# Patient Record
Sex: Female | Born: 1985 | Race: White | Hispanic: No | Marital: Married | Smoking: Never smoker
Health system: Southern US, Community
[De-identification: ages and names within clinical notes are randomized; demographics above are authoritative.]

## PROBLEM LIST (undated history)

## (undated) DIAGNOSIS — Z1379 Encounter for other screening for genetic and chromosomal anomalies: Secondary | ICD-10-CM

## (undated) DIAGNOSIS — E039 Hypothyroidism, unspecified: Secondary | ICD-10-CM

## (undated) DIAGNOSIS — F419 Anxiety disorder, unspecified: Secondary | ICD-10-CM

## (undated) DIAGNOSIS — T7840XA Allergy, unspecified, initial encounter: Secondary | ICD-10-CM

## (undated) DIAGNOSIS — R519 Headache, unspecified: Secondary | ICD-10-CM

## (undated) DIAGNOSIS — Z148 Genetic carrier of other disease: Secondary | ICD-10-CM

## (undated) DIAGNOSIS — Z9189 Other specified personal risk factors, not elsewhere classified: Secondary | ICD-10-CM

## (undated) DIAGNOSIS — Z803 Family history of malignant neoplasm of breast: Secondary | ICD-10-CM

## (undated) HISTORY — DX: Family history of malignant neoplasm of breast: Z80.3

## (undated) HISTORY — DX: Encounter for other screening for genetic and chromosomal anomalies: Z13.79

## (undated) HISTORY — DX: Anxiety disorder, unspecified: F41.9

## (undated) HISTORY — PX: OTHER SURGICAL HISTORY: SHX169

## (undated) HISTORY — DX: Hypothyroidism, unspecified: E03.9

## (undated) HISTORY — DX: Genetic carrier of other disease: Z14.8

## (undated) HISTORY — DX: Allergy, unspecified, initial encounter: T78.40XA

## (undated) HISTORY — DX: Other specified personal risk factors, not elsewhere classified: Z91.89

## (undated) HISTORY — PX: FOOT GANGLION EXCISION: SHX1660

---

## 2009-01-08 ENCOUNTER — Ambulatory Visit: Payer: Self-pay | Admitting: Internal Medicine

## 2009-09-20 ENCOUNTER — Emergency Department: Payer: Self-pay | Admitting: Emergency Medicine

## 2012-02-17 ENCOUNTER — Observation Stay: Payer: Self-pay

## 2012-02-23 ENCOUNTER — Observation Stay: Payer: Self-pay | Admitting: Obstetrics & Gynecology

## 2012-03-06 ENCOUNTER — Observation Stay: Payer: Self-pay

## 2012-03-08 ENCOUNTER — Inpatient Hospital Stay: Payer: Self-pay

## 2012-03-08 LAB — CBC WITH DIFFERENTIAL/PLATELET
Basophil #: 0 10*3/uL (ref 0.0–0.1)
Basophil %: 0.4 %
Eosinophil #: 0.1 10*3/uL (ref 0.0–0.7)
HCT: 40.9 % (ref 35.0–47.0)
Lymphocyte #: 2.4 10*3/uL (ref 1.0–3.6)
MCH: 33.5 pg (ref 26.0–34.0)
MCHC: 35.1 g/dL (ref 32.0–36.0)
MCV: 96 fL (ref 80–100)
Monocyte #: 1.5 x10 3/mm — ABNORMAL HIGH (ref 0.2–0.9)
Monocyte %: 13.5 %
Neutrophil #: 6.7 10*3/uL — ABNORMAL HIGH (ref 1.4–6.5)
RDW: 13.8 % (ref 11.5–14.5)
WBC: 10.7 10*3/uL (ref 3.6–11.0)

## 2012-03-10 LAB — HEMATOCRIT: HCT: 30.5 % — ABNORMAL LOW (ref 35.0–47.0)

## 2014-02-16 ENCOUNTER — Ambulatory Visit: Payer: Self-pay

## 2014-02-17 ENCOUNTER — Ambulatory Visit: Payer: Self-pay

## 2014-03-19 ENCOUNTER — Ambulatory Visit: Payer: Self-pay

## 2014-03-20 ENCOUNTER — Observation Stay: Payer: Self-pay | Admitting: Obstetrics and Gynecology

## 2014-03-27 ENCOUNTER — Inpatient Hospital Stay: Payer: Self-pay | Admitting: Obstetrics and Gynecology

## 2014-03-27 LAB — CBC WITH DIFFERENTIAL/PLATELET
BASOS ABS: 0 10*3/uL (ref 0.0–0.1)
Basophil %: 0.2 %
Eosinophil #: 0 10*3/uL (ref 0.0–0.7)
Eosinophil %: 0.1 %
HCT: 44.3 % (ref 35.0–47.0)
HGB: 14.7 g/dL (ref 12.0–16.0)
LYMPHS ABS: 1.3 10*3/uL (ref 1.0–3.6)
Lymphocyte %: 12.6 %
MCH: 32 pg (ref 26.0–34.0)
MCHC: 33.2 g/dL (ref 32.0–36.0)
MCV: 96 fL (ref 80–100)
MONOS PCT: 8.1 %
Monocyte #: 0.9 x10 3/mm (ref 0.2–0.9)
Neutrophil #: 8.4 10*3/uL — ABNORMAL HIGH (ref 1.4–6.5)
Neutrophil %: 79 %
Platelet: 166 10*3/uL (ref 150–440)
RBC: 4.6 10*6/uL (ref 3.80–5.20)
RDW: 13.9 % (ref 11.5–14.5)
WBC: 10.6 10*3/uL (ref 3.6–11.0)

## 2014-03-28 LAB — HEMATOCRIT: HCT: 38.1 % (ref 35.0–47.0)

## 2014-10-27 NOTE — H&P (Signed)
L&D Evaluation:  History:   HPI 29 year old G1 P0 with EDC9/21/2013 by LMP=06/03/2011 presents at 47 5/7 weeks with c/o strong regular contractions since 1700 tonight. Rates pain 8-9/10. No VB or LOF. Some muciod discharge. Baby active. Prenatal care at [facility] with a 10wk 6 day ultrasound confirming dates (EDC=03/12/2012). PNC also complicated by hypothyroidism, anxiety and migraine headache. LABS:O POS, RI, VI, GBS negative.    Presents with contractions    Patient's Medical History Anxiety, Hypothyroidism, Migraine headache    Patient's Surgical History none    Medications Pre Natal Vitamins  Synthroid 100 mcg daily, Zoloft 75 mgm daily.    Allergies NKDA    Social History none  Married    Family History Non-Contributory   ROS:   ROS see HPI   Exam:   Vital Signs 134/80. 98.2-79-18    Urine Protein not completed    General no apparent distress, appears anxious    Mental Status clear    Chest clear    Heart normal sinus rhythm, no murmur/gallop/rubs    Abdomen gravid, tender with contractions    Estimated Fetal Weight Average for gestational age, 12 1/2 to 7#    Fetal Position cephalic    Edema no edema    Reflexes 1+    Pelvic 1.5/80%/-2/vtx per RN exam    Mebranes Intact    FHT normal rate with no decels, 140 with accels to 170s to 180    FHT Description Cat 1    Fetal Heart Rate 140    Ucx q2-4 min,    Skin dry   Impression:   Impression IUP at 39 5/7 weeks with probable early labor   Plan:   Plan EFM/NST, monitor contractions and for cervical change, Rest with morphine 10 mgm and Phenrgan 12.5 mgm IM   Electronic Signatures: Dalia Heading L (CNM)  (Signed 18-Sep-13 22:51)  Authored: L&D Evaluation   Last Updated: 18-Sep-13 22:51 by Karene Fry (CNM)

## 2014-10-27 NOTE — H&P (Signed)
L&D Evaluation:  History:   HPI 29 year old G1 P0 with EDC9/21/2013 by LMP=06/03/2011 presents at 37 weeks with c/o contractions all day and that are now 7-10 min apart. No VB or LOF. Prenatal care at [facility] with a 10wk 6 day ultrasound confirming dates (EDC=03/12/2012). PNC also complicated by hypothyroidism, anxiety and migraine headache. LABS:O POS, RI, VI, GBS negative.    Presents with contractions    Patient's Medical History Anxiety, Hypothyroidism, Migraine headache    Patient's Surgical History none    Medications Pre Natal Vitamins  Synthroid 100 mcg daily, Zoloft 75 mgm daily.    Allergies NKDA    Social History none  Married    Family History Non-Contributory   ROS:   ROS see HPI   Exam:   Vital Signs 123/87, 118/79, [id number]    General no apparent distress    Mental Status clear    Abdomen gravid, non-tender, mild contraction palpated    Estimated Fetal Weight Average for gestational age    Fetal Position cephalic    Edema no edema    Reflexes 1+    Pelvic closed/50-60%/-2    Mebranes Intact    FHT normal rate with no decels    FHT Description initially baseline 140s with accels to 160s to 180s, then baseline chnged to 130 when baby not as active.    Fetal Heart Rate 135    Ucx q2-7, inconsistent duration and strength    Skin dry   Impression:   Impression IUP at 37 weeks with pteterm contractions. False labor vs very early latent labor. Reactive NST   Plan:   Plan Discharge home with labor precautions. FU next week as scheduled   Electronic Signatures: Karene Fry (CNM)  (Signed 01-Sep-13 11:02)  Authored: L&D Evaluation   Last Updated: 01-Sep-13 11:02 by Karene Fry (CNM)

## 2014-10-27 NOTE — H&P (Signed)
L&D Evaluation:  History:  HPI -CC: irregular UCs -HPI: 29 y/o G2P1001 @ 35/6 with the above CC. Preg c/b hypothyroidism (on synthroid) and anxiety (on zoloft). No VB, LOF or decreased FM. Patient called the office and was advised to come in for eval.   Allergies NKDA   Social History none   Family History Non-Contributory   Exam:  Vital Signs AF VS normal and stable   General no apparent distress   Mental Status clear   Chest no respiratory distress   Abdomen gravid, non-tender   Pelvic cl/long/high x 2 (over several hours)   FHT 130 baseline, +accels, no decels, mod var   Ucx irregular   Impression:  Impression negative EOL   Plan:  Comments *IUP: rNST *EOL: PTL precautions given *continue home medications *Dispo: d/c to home and already has f/u for monday   Follow Up Appointment already scheduled   Electronic Signatures: Aletha Halim (MD)  (Signed 02-Oct-15 20:35)  Authored: L&D Evaluation   Last Updated: 02-Oct-15 20:35 by Aletha Halim (MD)

## 2014-10-27 NOTE — H&P (Signed)
L&D Evaluation:  History:  HPI 29 y/o G2P1001 @ [redacted]w[redacted]d with contractions starting this morning. On presentation 3-4 cm, spontaneously ruptured during rule out period and admitted for term labor.  Preg c/b hypothyroidism (on synthroid) and anxiety (on zoloft). No VB,  +FM   Presents with contractions   Patient's Medical History hypothyroidism   Patient's Surgical History none   Medications Pre Natal Vitamins   Allergies NKDA   Social History none   Family History Non-Contributory   Exam:  Vital Signs AF VS normal and stable   General no apparent distress   Mental Status clear   Chest no respiratory distress   Abdomen gravid, non-tender   Pelvic 3-4/50/-2 SROM clear   Mebranes Ruptured   FHT reactive tracing, cat I   Ucx irregular, q4-5min   Impression:  Impression active labor   Plan:  Comments admit for term labor, GBS negative   Follow Up Appointment already scheduled   Electronic Signatures: Dorthula Nettles (MD)  (Signed 09-Oct-15 11:33)  Authored: L&D Evaluation   Last Updated: 09-Oct-15 11:33 by Dorthula Nettles (MD)

## 2015-10-07 DIAGNOSIS — M25571 Pain in right ankle and joints of right foot: Secondary | ICD-10-CM | POA: Diagnosis not present

## 2015-10-07 DIAGNOSIS — M67472 Ganglion, left ankle and foot: Secondary | ICD-10-CM | POA: Diagnosis not present

## 2015-11-11 DIAGNOSIS — M67472 Ganglion, left ankle and foot: Secondary | ICD-10-CM | POA: Diagnosis not present

## 2015-11-11 DIAGNOSIS — M25571 Pain in right ankle and joints of right foot: Secondary | ICD-10-CM | POA: Diagnosis not present

## 2015-11-11 DIAGNOSIS — M65871 Other synovitis and tenosynovitis, right ankle and foot: Secondary | ICD-10-CM | POA: Diagnosis not present

## 2015-12-01 DIAGNOSIS — M67472 Ganglion, left ankle and foot: Secondary | ICD-10-CM | POA: Diagnosis not present

## 2015-12-24 DIAGNOSIS — J029 Acute pharyngitis, unspecified: Secondary | ICD-10-CM | POA: Diagnosis not present

## 2016-03-07 DIAGNOSIS — M67472 Ganglion, left ankle and foot: Secondary | ICD-10-CM | POA: Diagnosis not present

## 2016-03-10 DIAGNOSIS — M85472 Solitary bone cyst, left ankle and foot: Secondary | ICD-10-CM | POA: Diagnosis not present

## 2016-04-03 DIAGNOSIS — H5711 Ocular pain, right eye: Secondary | ICD-10-CM | POA: Diagnosis not present

## 2016-04-14 DIAGNOSIS — Z23 Encounter for immunization: Secondary | ICD-10-CM | POA: Diagnosis not present

## 2016-05-09 DIAGNOSIS — M67472 Ganglion, left ankle and foot: Secondary | ICD-10-CM | POA: Diagnosis not present

## 2016-05-09 DIAGNOSIS — M79672 Pain in left foot: Secondary | ICD-10-CM | POA: Diagnosis not present

## 2016-06-06 DIAGNOSIS — M79672 Pain in left foot: Secondary | ICD-10-CM | POA: Diagnosis not present

## 2016-06-06 DIAGNOSIS — M67472 Ganglion, left ankle and foot: Secondary | ICD-10-CM | POA: Diagnosis not present

## 2016-07-10 DIAGNOSIS — R51 Headache: Secondary | ICD-10-CM | POA: Diagnosis not present

## 2016-07-10 DIAGNOSIS — H6691 Otitis media, unspecified, right ear: Secondary | ICD-10-CM | POA: Diagnosis not present

## 2016-08-31 ENCOUNTER — Encounter: Payer: Self-pay | Admitting: Obstetrics and Gynecology

## 2016-08-31 ENCOUNTER — Ambulatory Visit (INDEPENDENT_AMBULATORY_CARE_PROVIDER_SITE_OTHER): Payer: BLUE CROSS/BLUE SHIELD | Admitting: Obstetrics and Gynecology

## 2016-08-31 VITALS — BP 130/70 | HR 79 | Ht 59.0 in | Wt 138.0 lb

## 2016-08-31 DIAGNOSIS — Z131 Encounter for screening for diabetes mellitus: Secondary | ICD-10-CM

## 2016-08-31 DIAGNOSIS — Z13220 Encounter for screening for lipoid disorders: Secondary | ICD-10-CM | POA: Diagnosis not present

## 2016-08-31 DIAGNOSIS — Z6827 Body mass index (BMI) 27.0-27.9, adult: Secondary | ICD-10-CM | POA: Diagnosis not present

## 2016-08-31 DIAGNOSIS — Z1329 Encounter for screening for other suspected endocrine disorder: Secondary | ICD-10-CM | POA: Diagnosis not present

## 2016-08-31 DIAGNOSIS — Z803 Family history of malignant neoplasm of breast: Secondary | ICD-10-CM

## 2016-08-31 DIAGNOSIS — E663 Overweight: Secondary | ICD-10-CM

## 2016-08-31 DIAGNOSIS — Z01419 Encounter for gynecological examination (general) (routine) without abnormal findings: Secondary | ICD-10-CM

## 2016-08-31 DIAGNOSIS — Z8632 Personal history of gestational diabetes: Secondary | ICD-10-CM | POA: Diagnosis not present

## 2016-08-31 MED ORDER — LEVOTHYROXINE SODIUM 75 MCG PO TABS: 75.0000 ug | ORAL_TABLET | Freq: Every day | ORAL | 11 refills | Status: DC

## 2016-08-31 MED ORDER — SERTRALINE HCL 100 MG PO TABS: 100.0000 mg | ORAL_TABLET | Freq: Every day | ORAL | 11 refills | Status: DC

## 2016-08-31 NOTE — Progress Notes (Signed)
Gynecology Annual Exam  PCP: No PCP Per Patient  Chief Complaint: No chief complaint on file.   History of Present Illness: Patient is a 31 y.o. G2P2 presents for annual exam. The patient has no complaints today.   LMP: No LMP recorded. Patient is not currently having periods (Reason: IUD). Has occasional spotting with IUD but not frequent, no cramping or dysmenorrhea.  The patient is sexually active. She currently uses IUD: Present: yes for contraception. She denies dyspareunia.  The patient does perform self breast exams.  There is notable family history of breast or ovarian cancer in her family.  Patient sister had triple negative breast cancer at age 55.  The patient wears seatbelts: yes.   The patient has regular exercise: not asked.    The patient denies current symptoms of depression, stable on current dose of Zoloft.    Review of Systems: Review of Systems  Constitutional: Negative for chills and fever.  HENT: Negative for congestion.   Respiratory: Negative for cough and shortness of breath.   Cardiovascular: Negative for chest pain and palpitations.  Gastrointestinal: Negative for abdominal pain, constipation, diarrhea, heartburn, nausea and vomiting.  Genitourinary: Negative for dysuria, frequency and urgency.  Skin: Negative for itching and rash.  Neurological: Negative for dizziness and headaches.  Endo/Heme/Allergies: Negative for polydipsia.  Psychiatric/Behavioral: Negative for depression.   Past Medical History:  History reviewed. No pertinent past medical history.  Past Surgical History:  History reviewed. No pertinent surgical history.  Gynecologic History:  No LMP recorded. Patient is not currently having periods (Reason: IUD). Contraception: IUD placed 05/08/2014 Mirena Last Pap: Results were: NIL and HR HPV negative 2015   Obstetric History: G2P2  Family History:  Family History  Problem Relation Age of Onset  . Breast cancer Sister 25     Social History:  Social History   Social History  . Marital status: Married    Spouse name: N/A  . Number of children: N/A  . Years of education: N/A   Occupational History  . Not on file.   Social History Main Topics  . Smoking status: Never Smoker  . Smokeless tobacco: Never Used  . Alcohol use No  . Drug use: No  . Sexual activity: Yes    Birth control/ protection: IUD   Other Topics Concern  . Not on file   Social History Narrative  . No narrative on file    Allergies:  No Known Allergies  Medications: Prior to Admission medications   Not on File    Physical Exam Vitals: Blood pressure 130/70, pulse 79, height 4' 11" (1.499 m), weight 138 lb (62.6 kg). Body mass index is 27.87 kg/m.  General: NAD HEENT: normocephalic, anicteric Thyroid: no enlargement, no palpable nodules Pulmonary: No increased work of breathing, CTAB Cardiovascular: RRR, distal pulses 2+ Breast: Breast symmetrical, no tenderness, no palpable nodules or masses, no skin or nipple retraction present, no nipple discharge.  No axillary or supraclavicular lymphadenopathy. Abdomen: NABS, soft, non-tender, non-distended.  Umbilicus without lesions.  No hepatomegaly, splenomegaly or masses palpable. No evidence of hernia  Genitourinary:  External: Normal external female genitalia.  Normal urethral meatus, normal  Bartholin's and Skene's glands.    Vagina: Normal vaginal mucosa, no evidence of prolapse.    Cervix: Grossly normal in appearance, no bleeding  Uterus: Non-enlarged, mobile, normal contour.  No CMT, IUD strings present  Adnexa: ovaries non-enlarged, no adnexal masses  Rectal: deferred  Lymphatic: no evidence of inguinal lymphadenopathy Extremities:  no edema, erythema, or tenderness Neurologic: Grossly intact Psychiatric: mood appropriate, affect full  Female chaperone present for pelvic and breast  portions of the physical exam    Assessment: 31 y.o. G2P2 routine annual  exam  Plan: Problem List Items Addressed This Visit    None    Visit Diagnoses    Encounter for annual routine gynecological examination    -  Primary   Relevant Orders   CMP14+LP+TP+TSH+CBC/Plt   Thyroid disorder screening       Relevant Orders   CMP14+LP+TP+TSH+CBC/Plt   Screening cholesterol level       Relevant Orders   CMP14+LP+TP+TSH+CBC/Plt   Diabetes mellitus screening       Relevant Orders   CMP14+LP+TP+TSH+CBC/Plt   History of gestational diabetes       Relevant Orders   CMP14+LP+TP+TSH+CBC/Plt   Overweight (BMI 25.0-29.9)       Relevant Orders   CMP14+LP+TP+TSH+CBC/Plt   BMI 27.0-27.9,adult       Relevant Orders   CMP14+LP+TP+TSH+CBC/Plt   Family history of breast cancer in first degree relative          1) STI screening was offered and declined  2) ASCCP guidelines and rational discussed.  Patient opts for every 5 years screening interval  3) Contraception -Continue Mirena IUD placed 05/08/2014  4) Routine healthcare maintenance including cholesterol, diabetes screening discussed To return fasting at a later date  5) Family history of breast cancer - female first degree relative with breast cancer triple negative at age 26, offered myriad testing and given handout  6) Follow up 1 year for routine annual exam  

## 2016-08-31 NOTE — Patient Instructions (Signed)
BRCA Gene Testing Why am I having this test? BRCA gene testing is done to check for the presence of harmful changes (mutations) in the BRCA1 gene or the BRCA2 gene (breast cancer susceptibility genes). If there is a mutation, the genes may not be able to help repair damaged cells in the body. As a result, the damaged cells may develop defects that can lead to certain types of cancer. You may have this test if you have a family history of certain types of cancer, including cancer of the:  Breast.  Ovaries.  Fallopian tubes.  Peritoneum.  Pancreas.  Prostate. What kind of sample is taken? The test requires either a sample of blood or a sample of cells from your saliva. If a sample of blood is needed, it will probably be collected by inserting a needle into a vein. If a sample of saliva is needed, you will get instructions about how to collect the sample. What do the results mean? The test results can show whether you have a mutation in the BRCA1 or BRCA2 gene that increases your risk for certain cancers. Meaning of negative test results  A negative test result means that you do not have a mutation in the BRCA1 or BRCA2 gene that is known to increase your risk for certain cancers. This does not mean that you will never get cancer. Talk with your health care provider or a genetic counselor about what this result means for you. Meaning of positive test results  A positive test result means that you have a mutation in the BRCA1 or BRCA2 gene that increases your risk for certain cancers. Women with a positive test result have an increased risk for breast and ovarian cancer. Both women and men with a mutation have an increased risk for breast cancer and may be at greater risk for other types of cancer. Getting a positive test result does not mean that you will develop cancer. Talk with your health care provider or a genetic counselor about what this result means for you. You may be told that you are  a carrier. This means that you can pass the mutation to your children. Meaning of ambiguous test results  Ambiguous, inconclusive, or uncertain test results mean that there is a change in the BRCA1 or BRCA2 gene, but it is a change that has not been linked to cancer. Talk with your health care provider or a genetic counselor about what this result means for you. Talk with your health care provider to discuss your results, treatment options, and if necessary, the need for more tests. Talk with your health care provider if you have any questions about your results. How do I get my results? It is up to you to get your test results. Ask your health care provider, or the department that is doing the test, when your results will be ready. This information is not intended to replace advice given to you by your health care provider. Make sure you discuss any questions you have with your health care provider. Document Released: 06/29/2004 Document Revised: 02/07/2016 Document Reviewed: 01/26/2016 Elsevier Interactive Patient Education  2017 [employer].

## 2016-09-11 ENCOUNTER — Encounter: Payer: Self-pay | Admitting: Obstetrics and Gynecology

## 2016-10-20 ENCOUNTER — Other Ambulatory Visit: Payer: BLUE CROSS/BLUE SHIELD

## 2016-10-20 DIAGNOSIS — Z01419 Encounter for gynecological examination (general) (routine) without abnormal findings: Secondary | ICD-10-CM

## 2016-10-20 DIAGNOSIS — Z1329 Encounter for screening for other suspected endocrine disorder: Secondary | ICD-10-CM

## 2016-10-20 DIAGNOSIS — Z13220 Encounter for screening for lipoid disorders: Secondary | ICD-10-CM

## 2016-10-20 DIAGNOSIS — Z131 Encounter for screening for diabetes mellitus: Secondary | ICD-10-CM

## 2016-10-20 DIAGNOSIS — E663 Overweight: Secondary | ICD-10-CM

## 2016-10-20 DIAGNOSIS — Z8632 Personal history of gestational diabetes: Secondary | ICD-10-CM | POA: Diagnosis not present

## 2016-10-20 DIAGNOSIS — Z6827 Body mass index (BMI) 27.0-27.9, adult: Secondary | ICD-10-CM | POA: Diagnosis not present

## 2016-10-21 LAB — CMP14+LP+TP+TSH+CBC/PLT
A/G RATIO: 1.4 (ref 1.2–2.2)
ALT: 12 IU/L (ref 0–32)
AST: 18 IU/L (ref 0–40)
Albumin: 4.3 g/dL (ref 3.5–5.5)
Alkaline Phosphatase: 85 IU/L (ref 39–117)
BILIRUBIN TOTAL: 0.4 mg/dL (ref 0.0–1.2)
BUN/Creatinine Ratio: 17 (ref 9–23)
BUN: 13 mg/dL (ref 6–20)
CALCIUM: 9.7 mg/dL (ref 8.7–10.2)
CO2: 24 mmol/L (ref 18–29)
Chloride: 100 mmol/L (ref 96–106)
Cholesterol, Total: 145 mg/dL (ref 100–199)
Creatinine, Ser: 0.75 mg/dL (ref 0.57–1.00)
Free Thyroxine Index: 2 (ref 1.2–4.9)
GFR calc Af Amer: 121 mL/min/{1.73_m2} (ref >59)
GFR calc non Af Amer: 105 mL/min/{1.73_m2} (ref >59)
GLOBULIN, TOTAL: 3 g/dL (ref 1.5–4.5)
Glucose: 85 mg/dL (ref 65–99)
HDL: 33 mg/dL — ABNORMAL LOW (ref >39)
Hematocrit: 42.2 % (ref 34.0–46.6)
Hemoglobin: 14.2 g/dL (ref 11.1–15.9)
LDL CALC: 101 mg/dL — AB (ref 0–99)
LDL/HDL RATIO: 3.1 ratio (ref 0.0–3.2)
MCH: 30.2 pg (ref 26.6–33.0)
MCHC: 33.6 g/dL (ref 31.5–35.7)
MCV: 90 fL (ref 79–97)
POTASSIUM: 4.5 mmol/L (ref 3.5–5.2)
Platelets: 197 10*3/uL (ref 150–379)
RBC: 4.7 x10E6/uL (ref 3.77–5.28)
RDW: 13 % (ref 12.3–15.4)
SODIUM: 139 mmol/L (ref 134–144)
T3 UPTAKE RATIO: 30 % (ref 24–39)
T4, Total: 6.5 ug/dL (ref 4.5–12.0)
TRIGLYCERIDES: 53 mg/dL (ref 0–149)
TSH: 0.834 u[IU]/mL (ref 0.450–4.500)
Total Protein: 7.3 g/dL (ref 6.0–8.5)
VLDL Cholesterol Cal: 11 mg/dL (ref 5–40)
WBC: 4.1 10*3/uL (ref 3.4–10.8)

## 2016-10-23 ENCOUNTER — Encounter: Payer: Self-pay | Admitting: Obstetrics and Gynecology

## 2016-11-17 DIAGNOSIS — Z1379 Encounter for other screening for genetic and chromosomal anomalies: Secondary | ICD-10-CM

## 2016-11-17 HISTORY — DX: Encounter for other screening for genetic and chromosomal anomalies: Z13.79

## 2016-12-07 ENCOUNTER — Encounter: Payer: Self-pay | Admitting: Obstetrics and Gynecology

## 2016-12-08 ENCOUNTER — Ambulatory Visit (INDEPENDENT_AMBULATORY_CARE_PROVIDER_SITE_OTHER): Payer: BLUE CROSS/BLUE SHIELD | Admitting: Obstetrics and Gynecology

## 2016-12-08 ENCOUNTER — Encounter: Payer: Self-pay | Admitting: Obstetrics and Gynecology

## 2016-12-08 DIAGNOSIS — Z171 Estrogen receptor negative status [ER-]: Secondary | ICD-10-CM | POA: Diagnosis not present

## 2016-12-08 DIAGNOSIS — Z803 Family history of malignant neoplasm of breast: Secondary | ICD-10-CM | POA: Diagnosis not present

## 2016-12-08 DIAGNOSIS — Z8042 Family history of malignant neoplasm of prostate: Secondary | ICD-10-CM | POA: Diagnosis not present

## 2016-12-08 DIAGNOSIS — Z0189 Encounter for other specified special examinations: Secondary | ICD-10-CM

## 2016-12-10 NOTE — Progress Notes (Signed)
MYRIAD TESTING AS DISCUSSED AT [place]

## 2016-12-17 DIAGNOSIS — Z9189 Other specified personal risk factors, not elsewhere classified: Secondary | ICD-10-CM

## 2016-12-17 DIAGNOSIS — Z1589 Genetic susceptibility to other disease: Secondary | ICD-10-CM

## 2016-12-17 DIAGNOSIS — Z148 Genetic carrier of other disease: Secondary | ICD-10-CM

## 2016-12-17 HISTORY — DX: Genetic susceptibility to malignant neoplasm of breast: Z15.89

## 2016-12-17 HISTORY — DX: Genetic carrier of other disease: Z14.8

## 2016-12-17 HISTORY — DX: Other specified personal risk factors, not elsewhere classified: Z91.89

## 2016-12-26 ENCOUNTER — Encounter: Payer: Self-pay | Admitting: Obstetrics and Gynecology

## 2017-03-02 ENCOUNTER — Ambulatory Visit: Payer: BLUE CROSS/BLUE SHIELD | Admitting: Obstetrics and Gynecology

## 2017-03-14 ENCOUNTER — Ambulatory Visit: Payer: BLUE CROSS/BLUE SHIELD | Admitting: Obstetrics and Gynecology

## 2017-04-02 ENCOUNTER — Encounter: Payer: Self-pay | Admitting: Obstetrics and Gynecology

## 2017-04-02 ENCOUNTER — Ambulatory Visit (INDEPENDENT_AMBULATORY_CARE_PROVIDER_SITE_OTHER): Payer: BLUE CROSS/BLUE SHIELD | Admitting: Obstetrics and Gynecology

## 2017-04-02 VITALS — BP 110/70 | HR 75 | Ht 59.0 in | Wt 133.0 lb

## 2017-04-02 DIAGNOSIS — Z9189 Other specified personal risk factors, not elsewhere classified: Secondary | ICD-10-CM

## 2017-04-02 DIAGNOSIS — Z1501 Genetic susceptibility to malignant neoplasm of breast: Secondary | ICD-10-CM

## 2017-04-02 DIAGNOSIS — Z1589 Genetic susceptibility to other disease: Secondary | ICD-10-CM

## 2017-04-03 NOTE — Progress Notes (Signed)
Obstetrics & Gynecology Office Visit   Chief Complaint:  Chief Complaint  Patient presents with  . Follow-up    MyRisk Results    History of Present Illness: 31 yo G2P2002 presenting to discuss results of her MyRisk results and discuss implications and surveillance options.  She has noted no new beast complaints, no new gyn complaints. Testing was initiated secondary to Ashkenazi Jewish ancestry and family history of breast cancer (sister age 12).   Review of Systems: Review of systems negative unless otherwise noted in HPI  Past Medical History:  Past Medical History:  Diagnosis Date  . Anxiety   . 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 Family history of breast cancer   . Genetic testing of female 11/2016   BRIP1 POSITIVE  . Hypothyroidism   . Increased risk of breast cancer 12/2016   based on family hx-->IBIS=23.9%, riskscore=20.4%    Past Surgical History:  History reviewed. No pertinent surgical history.  Gynecologic History: No LMP recorded. Patient is not currently having periods (Reason: IUD).  Obstetric History: [id number]  Family History:  Family History  Problem Relation Age of Onset  . Breast cancer Sister 88       triple negative  . Hypertension Mother   . Hypothyroidism Mother   . Hypertension Father   . Hypothyroidism Father   . Breast cancer Other     Social History:  Social History   Social History  . Marital status: Married    Spouse name: N/A  . Number of children: N/A  . Years of education: N/A   Occupational History  . Not on file.   Social History Main Topics  . Smoking status: Never Smoker  . Smokeless tobacco: Never Used  . Alcohol use No  . Drug use: No  . Sexual activity: Yes    Birth control/ protection: IUD   Other Topics Concern  . Not on file   Social History Narrative  . No narrative on file    Allergies:  No Known Allergies  Medications: Prior to  Admission medications   Medication Sig Start Date End Date Taking? Authorizing Provider  levothyroxine (SYNTHROID) 75 MCG tablet Take 1 tablet (75 mcg total) by mouth daily. 08/31/16 08/31/17 Yes Malachy Mood, MD  sertraline (ZOLOFT) 100 MG tablet Take 1 tablet (100 mg total) by mouth daily. 08/31/16  Yes Malachy Mood, MD    Physical Exam Vitals:  Vitals:   04/02/17 1614  BP: 110/70  Pulse: 75   No LMP recorded. Patient is not currently having periods (Reason: IUD).  General: NAD HEENT: normocephalic, anicteric Pulmonary: No increased work of breathing Neurologic: Grossly intact Psychiatric: mood appropriate, affect full  Female chaperone present for pelvic and breast  portions of the physical exam  Assessment: 31 y.o. G2P2002 positive MyRISK results  Plan: Problem List Items Addressed This Visit      Other   Monoallelic mutation of BRIP1 gene   Relevant Orders   MM DIGITAL SCREENING BILATERAL   US PELVIS TRANSVANGINAL NON-OB (TV ONLY)    Other Visit Diagnoses    At high risk for breast cancer    -  Primary   Relevant Orders   MM DIGITAL SCREENING BILATERAL   US PELVIS TRANSVANGINAL NON-OB (TV ONLY)      Genetic testing: Myrisk testing was negative for BRCA1/2 or other mutations in APC, ATM, BARD1, BMPR1A, BRIP1, CDH1, CDK4, CDKN2A, CHEK2, EPCAM, MLH1, MSH2, MSH6,  MUTYH, NBN, PALB2, PMS2, PTEN, RAD51C, RAD51D, SMAD4, STK11, TP53. POLE, POLD1, and GREM1 sequencing in select regions also negative.  The patient did test positive for heterozygous mutation in BRIP1. 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  Her sister did have BRCA testing which was negative but her BRIP1 carrier status is unknown.  Despite elevated breast cancer risk but not high risk association with breast cancer given family history I think it is at least reasonable to consider early mammography as well as twice yearly clinical breast exams.  I will arrange referral to high risk breast cancer clinic at [facility] for further management recommendation.  There is not clear consensus on how to follow patient at elevated risk for BRIP1 mutation as far as ovarian cancer risk is concerned.  While increased the risk is significantly lower than the risk conferred with BRCA mutations.  Hormonal protection of ovaries with OCP, screening ultrasounds, and CA-125 were discussed.  We discussed the draw back of CA-125 and ultrasound in screening for ovarian cancer, and given the risk of false positives this is not currently recommended for low risk patients.  At presents the patient is leaning towards at least performing yearly screening ultrasounds to assess ovaries.  We also briefly discussed risk reducing BSO at age 24-50.  A total of 15 minutes were spent in face-to-face contact with the patient during this encounter with over half of that time devoted to counseling and coordination of care.

## 2017-04-10 ENCOUNTER — Ambulatory Visit (INDEPENDENT_AMBULATORY_CARE_PROVIDER_SITE_OTHER): Payer: BLUE CROSS/BLUE SHIELD | Admitting: Obstetrics and Gynecology

## 2017-04-10 ENCOUNTER — Ambulatory Visit
Admission: RE | Admit: 2017-04-10 | Discharge: 2017-04-10 | Disposition: A | Discharge status: Home / Self Care | Payer: BLUE CROSS/BLUE SHIELD | Source: Ambulatory Visit | Attending: Obstetrics and Gynecology | Admitting: Obstetrics and Gynecology

## 2017-04-10 ENCOUNTER — Ambulatory Visit (INDEPENDENT_AMBULATORY_CARE_PROVIDER_SITE_OTHER): Payer: BLUE CROSS/BLUE SHIELD

## 2017-04-10 ENCOUNTER — Encounter: Payer: Self-pay | Admitting: Obstetrics and Gynecology

## 2017-04-10 VITALS — BP 112/66 | HR 69 | Wt 132.0 lb

## 2017-04-10 DIAGNOSIS — Z9189 Other specified personal risk factors, not elsewhere classified: Secondary | ICD-10-CM

## 2017-04-10 DIAGNOSIS — Z803 Family history of malignant neoplasm of breast: Secondary | ICD-10-CM | POA: Diagnosis not present

## 2017-04-10 DIAGNOSIS — N83201 Unspecified ovarian cyst, right side: Secondary | ICD-10-CM | POA: Diagnosis not present

## 2017-04-10 DIAGNOSIS — Z1231 Encounter for screening mammogram for malignant neoplasm of breast: Secondary | ICD-10-CM | POA: Diagnosis not present

## 2017-04-10 DIAGNOSIS — Z1501 Genetic susceptibility to malignant neoplasm of breast: Secondary | ICD-10-CM

## 2017-04-10 DIAGNOSIS — Z1589 Genetic susceptibility to other disease: Secondary | ICD-10-CM

## 2017-04-10 NOTE — Progress Notes (Signed)
Gynecology Ultrasound Follow Up  Chief Complaint:  Chief Complaint  Patient presents with  . U/S follow up     History of Present Illness: Patient is a 31 y.o. female who presents today for ultrasound evaluation of family history of early onset breast cancer and BRIP1 heterozygous with increased lifetime risk of ovarian cancer.  Ultrasound demonstrates the following findgins Adnexa:Right ovary 1.11cm x 1.24cm solid component, hyperechoic, well circumscribed.  Left ovary normal Uterus: No fibroids, normal endometrial stripe Additional: No free fluid  Review of Systems: Review of Systems  Constitutional: Negative for weight loss.  Gastrointestinal: Negative for abdominal pain, constipation and nausea.    Past Medical History:  Past Medical History:  Diagnosis Date  . Anxiety   . 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 Family history of breast cancer   . Genetic testing of female 11/2016   BRIP1 POSITIVE  . Hypothyroidism   . Increased risk of breast cancer 12/2016   based on family hx-->IBIS=23.9%, riskscore=20.4%    Past Surgical History:  History reviewed. No pertinent surgical history.  Gynecologic History:  No LMP recorded. Patient is not currently having periods (Reason: IUD).  Family History:  Family History  Problem Relation Age of Onset  . Breast cancer Sister 57       triple negative  . Hypertension Mother   . Hypothyroidism Mother   . Hypertension Father   . Hypothyroidism Father   . Breast cancer Other     Social History:  Social History   Social History  . Marital status: Married    Spouse name: N/A  . Number of children: N/A  . Years of education: N/A   Occupational History  . Not on file.   Social History Main Topics  . Smoking status: Never Smoker  . Smokeless tobacco: Never Used  . Alcohol use No  . Drug use: No  . Sexual activity: Yes    Birth control/ protection: IUD    Other Topics Concern  . Not on file   Social History Narrative  . No narrative on file    Allergies:  No Known Allergies  Medications: Prior to Admission medications   Medication Sig Start Date End Date Taking? Authorizing Provider  levothyroxine (SYNTHROID) 75 MCG tablet Take 1 tablet (75 mcg total) by mouth daily. 08/31/16 08/31/17 Yes Malachy Mood, MD  sertraline (ZOLOFT) 100 MG tablet Take 1 tablet (100 mg total) by mouth daily. 08/31/16  Yes Malachy Mood, MD    Physical Exam Vitals: Blood pressure 112/66, pulse 69, weight 132 lb (59.9 kg).  General: NAD HEENT: normocephalic, anicteric Pulmonary: No increased work of breathing Extremities: no edema, erythema, or tenderness Neurologic: Grossly intact, normal gait Psychiatric: mood appropriate, affect full   Assessment: 31 y.o. 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  Asymptomatic.  Given BRIP1 gene will consider prophylactic BSO at age 62-50.  We discussed use of hormonal contraception for ovarian protection, currently has Mirena IUD consider switching to systemic once ready for Mirena removal  2) Consults with high risk breast clinic pending.  Most  of her breast cancer risk comes secondary to her sisters young age at diagnosis as BRIP1 has some association with breast cancer but higher association with ovarian cancer.  Mammogram pending.  Every 6 months clinical breast exams  3) A total of 15 minutes were spent in face-to-face contact with the patient during this encounter with over half of that time devoted to counseling and coordination of care.

## 2017-04-11 ENCOUNTER — Other Ambulatory Visit: Payer: Self-pay | Admitting: Obstetrics and Gynecology

## 2017-04-11 DIAGNOSIS — N6489 Other specified disorders of breast: Secondary | ICD-10-CM

## 2017-04-11 DIAGNOSIS — R928 Other abnormal and inconclusive findings on diagnostic imaging of breast: Secondary | ICD-10-CM

## 2017-04-14 ENCOUNTER — Encounter: Payer: Self-pay | Admitting: Obstetrics and Gynecology

## 2017-04-18 ENCOUNTER — Ambulatory Visit: Payer: BLUE CROSS/BLUE SHIELD

## 2017-04-18 ENCOUNTER — Other Ambulatory Visit: Payer: BLUE CROSS/BLUE SHIELD

## 2017-04-20 ENCOUNTER — Ambulatory Visit
Admission: RE | Admit: 2017-04-20 | Discharge: 2017-04-20 | Disposition: A | Discharge status: Home / Self Care | Payer: BLUE CROSS/BLUE SHIELD | Source: Ambulatory Visit | Attending: Obstetrics and Gynecology | Admitting: Obstetrics and Gynecology

## 2017-04-20 DIAGNOSIS — R928 Other abnormal and inconclusive findings on diagnostic imaging of breast: Secondary | ICD-10-CM | POA: Diagnosis not present

## 2017-04-20 DIAGNOSIS — N6489 Other specified disorders of breast: Secondary | ICD-10-CM

## 2017-04-23 ENCOUNTER — Encounter: Payer: Self-pay | Admitting: Obstetrics and Gynecology

## 2017-05-24 ENCOUNTER — Telehealth: Payer: Self-pay | Admitting: Genetic Counselor

## 2017-05-24 NOTE — Telephone Encounter (Addendum)
Tara Osborn was called again today and was available to schedule a genetic counseling appointment for later today 05/28/17 at 10:30am.  ----------------------------------   Tara Osborn was referred for genetic counseling by Dr. Malachy Mood due to a recently identified BRIP1 mutation. I left her messages on 05/18/17 and 05/24/17 to call and schedule this telegenetics visit to be done by phone at her convinience.   Steele Berg, [place], [place] Genetic Counselor Phone: [phone]

## 2017-05-28 ENCOUNTER — Telehealth: Payer: Self-pay | Admitting: Genetic Counselor

## 2017-05-28 ENCOUNTER — Encounter: Payer: Self-pay | Admitting: Genetic Counselor

## 2017-05-28 DIAGNOSIS — Z803 Family history of malignant neoplasm of breast: Secondary | ICD-10-CM | POA: Insufficient documentation

## 2017-05-28 NOTE — Telephone Encounter (Signed)
Cancer Genetics            Telegenetics Initial Visit   Patient Name: Tara Osborn Patient DOB: 1985-10-01 Patient Age: 31 y.o. Phone Call Date: 05/28/2017  Referring Provider: Malachy Mood, MD  Reason for Visit: Discuss results of genetic testing   History of Present Illness: [name], a 31 y.o. female, was referred for genetic counseling to discuss results of recent genetic testing she had coordinated by Dr. Georgianne Fick. This was a telegenetics visit via phone.  [name] has no personal history of cancer.  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 A copy of the genetic test report is already scanned into Epic under the media tab.  Family History: Significant diagnoses include the following:  Family History  Problem Relation Age of Onset  . Breast cancer Sister 81       triple negative; currently 97  . Hypertension Mother   . Hypothyroidism Mother   . Hypertension Father   . Hypothyroidism Father     Additionally, [name] has one son (age 54) and one daughter (age 25). She has one full sister (noted above) who has not yet had genetic testing. She also has a maternal half-sister (age 86). Her mother (age 23) and her father (age 64) are cancer-free. There are no cancers in any aunts, uncles or grandparents.  [name]'s ancestry is Caucasian - NOS. There is Jewish ancestry on her paternal side of the family. There is no consanguinity.  Discussion of BRIP1-Related Cancer Risks: We reviewed the characteristics, features and inheritance patterns of hereditary cancer syndromes with a focus on the BRIP1 gene. This gene was only recently found to be associated with an increased risk of ovarian cancer, thus we do not know as much about it as we do genes like BRCA1 and BRCA2. The role of the BRIP1 gene is  just starting to be understood, but it is known to play a role in the pathway that is critical for DNA repair. While data is still relatively limited, studies suggest mutations in BRIP1 increase the risk of developing ovarian caner to about 6% lifetime risk. There is some data that suggest it increases breast cancer risk as well, but specific lifetime risks are not well-defined at this time and recent studies are conflicting.   Having two pathogenic mutations in the BRIP1 gene causes Fanconi anemia type J (FA-J), a rare autosomal recessive disorder affecting multiple body systems.  Because the risk estimates for BRIP1 are likely to change as new data is obtained, we recommend [name] check back with Genetics periodically to ensure she is recieving the most up-to-date cancer screening recommendations for individuals with a BRIP1 mutation.   Medical Management: An individual's cancer risk and medical management are not determined by genetic test results alone. Overall cancer risk assessment incorporates additional factors, including personal medical history, family history, and any available genetic information that may result in a personalized plan for cancer prevention and surveillance. Because the [employer]  (NCCN) updates these guidelines periodically, clinicians are recommended to view the most recent guidelines at https://www.martin.info/.   There are currently limited guidelines from the NCCN for women with a BRIP1 mutation. The Genetic/Familial High-Risk Assessment: Breast and Ovarian (V2.2019) guidelines recommend discussing risk-reducing BSO around age 66-50 (or earlier  based on family history) given the ovarian cancer risk associated with BRIP1 mutations. Screening with CA-125 blood tests and transvaginal ultrasounds are not generally recommended for ovarian cancer screening because these tests have not been shown to detect ovarian cancer at an early stage.   NCCN gives no  specific recommendations regarding breast cancer screening or risk-reduction given the limited evidence of breast cancer being increased by a BRIP1 mutation. We discussed that some providers will screen women for breast cancer with a yearly mammogram and breast MRI, even though NCCN does not have guidelines yet. For [name], this would be the recommendation based on her sister's young age at breast cancer diagnosis. There is insufficient evidence at this time to recommend prophylactic bilateral mastectomies, but this remains a choice that some women may pursue.   We discussed that because an affected family member has not been tested for this mutation, it is unclear if this BRIP1 mutation is the cause of breast cancer in her sister at age 80. It remains possible that her sister may have had a mutation in a highly penetrant gene, that [name] did not inherit. It is also unclear as to which side of the family has this BRIP1 mutation, as her parents have not been tested. We discussed the possibility of testing one of her parents to determine which side of the family this mutation was inherited from, but Ms. Adames understands that this would not change her own medical management at this time.  Family Members: It is important that [name] informs her relatives of this result. They are recommended to speak with a genetic counselor prior to any testing. A genetic counselor can be located at [email or web address].   We discussed that if a child inherits two BRIP1 mutations, one from each parent, they would have a rare genetic condition called Fanconi Anemia. For this reason, if anyone in the family who has this BRIP1 mutation is considering having children, they are recommended to have their partner tested.    We encouraged [name] to remain in contact with [place] on an annual basis so we can update her personal and family histories, and let her know of advances in cancer genetics that may benefit the family.  Our contact number was provided. [name]'s questions were answered to her satisfaction today, and she knows she is welcome to call anytime with additional questions.    Dr. Grayland Ormond was available for questions concerning this case. Total time spent by counseling by phone was approximately 30 minutes.    Steele Berg, MS, [place] Certified Genetic Counselor phone: [phone]

## 2017-08-08 ENCOUNTER — Other Ambulatory Visit: Payer: Self-pay | Admitting: Obstetrics and Gynecology

## 2017-09-06 ENCOUNTER — Other Ambulatory Visit: Payer: Self-pay | Admitting: Obstetrics and Gynecology

## 2017-09-06 ENCOUNTER — Ambulatory Visit (INDEPENDENT_AMBULATORY_CARE_PROVIDER_SITE_OTHER): Payer: BLUE CROSS/BLUE SHIELD | Admitting: Obstetrics and Gynecology

## 2017-09-06 ENCOUNTER — Encounter: Payer: Self-pay | Admitting: Obstetrics and Gynecology

## 2017-09-06 DIAGNOSIS — Z01419 Encounter for gynecological examination (general) (routine) without abnormal findings: Secondary | ICD-10-CM

## 2017-09-06 DIAGNOSIS — Z1239 Encounter for other screening for malignant neoplasm of breast: Secondary | ICD-10-CM

## 2017-09-06 DIAGNOSIS — Z1231 Encounter for screening mammogram for malignant neoplasm of breast: Secondary | ICD-10-CM | POA: Diagnosis not present

## 2017-09-06 MED ORDER — LEVOTHYROXINE SODIUM 75 MCG PO TABS: 75.0000 ug | ORAL_TABLET | Freq: Every day | ORAL | 2 refills | Status: DC

## 2017-09-06 NOTE — Progress Notes (Signed)
Gynecology Annual Exam   PCP: Patient, No Pcp Per  Chief Complaint:  Chief Complaint  Patient presents with  . Gynecologic Exam    REfill on medication    History of Present Illness: Patient is a 32 y.o. [id number] presents for annual exam. The patient has no complaints today.   LMP: No LMP recorded. (Menstrual status: IUD).  Achieved amenorrhea on Mirena.   The patient is sexually active. She currently uses IUD for contraception. She denies dyspareunia.  The patient does perform self breast exams.  There is notable family history of breast or ovarian cancer in her family.  The patient wears seatbelts: yes.   The patient has regular exercise: not asked.    The patient denies current symptoms of depression.    GAD-7 is 3, PHQ-9 is 4  Review of Systems: ROS  Past Medical History:  Past Medical History:  Diagnosis Date  . Anxiety   . 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 Family history of breast cancer   . Family history of breast cancer   . Genetic testing of female 11/2016   BRIP1 POSITIVE  . Hypothyroidism   . Increased risk of breast cancer 12/2016   based on family hx-->IBIS=23.9%, riskscore=20.4%    Past Surgical History:  History reviewed. No pertinent surgical history.  Gynecologic History:  No LMP recorded. (Menstrual status: IUD). Contraception: IUD Mirena 05/08/2014 Last Pap: Results were: NIL and HR HPV negative 05/08/2014  Obstetric History: G2P2002  Family History:  Family History  Problem Relation Age of Onset  . Breast cancer Sister 45       triple negative; currently 28  . Hypertension Mother   . Hypothyroidism Mother   . Hypertension Father   . Hypothyroidism Father     Social History:  Social History   Socioeconomic History  . Marital status: Married    Spouse name: Not on file  . Number of children: Not on file  . Years of education: Not on file  . Highest education level:  Not on file  Occupational History  . Not on file  Social Needs  . Financial resource strain: Not on file  . Food insecurity:    Worry: Not on file    Inability: Not on file  . Transportation needs:    Medical: Not on file    Non-medical: Not on file  Tobacco Use  . Smoking status: Never Smoker  . Smokeless tobacco: Never Used  Substance and Sexual Activity  . Alcohol use: No  . Drug use: No  . Sexual activity: Yes    Birth control/protection: IUD  Lifestyle  . Physical activity:    Days per week: 5 days    Minutes per session: 20 min  . Stress: To some extent  Relationships  . Social connections:    Talks on phone: More than three times a week    Gets together: Once a week    Attends religious service: Never    Active member of club or organization: No    Attends meetings of clubs or organizations: Never    Relationship status: Married  . Intimate partner violence:    Fear of current or ex partner: No    Emotionally abused: No    Physically abused: No    Forced sexual activity: No  Other Topics Concern  . Not on file  Social History Narrative  . Not on file  Allergies:  No Known Allergies  Medications: Prior to Admission medications   Medication Sig Start Date End Date Taking? Authorizing Provider  levothyroxine (SYNTHROID, LEVOTHROID) 75 MCG tablet Take 1 tablet (75 mcg total) by mouth daily. 08/08/17 08/08/18 Yes Malachy Mood, MD  sertraline (ZOLOFT) 100 MG tablet TAKE 1 TABLET BY MOUTH EVERY DAY 09/06/17  Yes Malachy Mood, MD    Physical Exam Vitals: Blood pressure 118/72, pulse 89, height 4' 11"  (1.499 m), weight 136 lb (61.7 kg).  General: NAD HEENT: normocephalic, anicteric Thyroid: no enlargement, no palpable nodules Pulmonary: No increased work of breathing, CTAB Cardiovascular: RRR, distal pulses 2+ Breast: Breast symmetrical, no tenderness, no palpable nodules or masses, no skin or nipple retraction present, no nipple discharge.  No  axillary or supraclavicular lymphadenopathy. Abdomen: NABS, soft, non-tender, non-distended.  Umbilicus without lesions.  No hepatomegaly, splenomegaly or masses palpable. No evidence of hernia  Genitourinary:  External: Normal external female genitalia.  Normal urethral meatus, normal Bartholin's and Skene's glands.    Vagina: Normal vaginal mucosa, no evidence of prolapse.    Cervix: Grossly normal in appearance, no bleeding  Uterus: Non-enlarged, mobile, normal contour.  No CMT  Adnexa: ovaries non-enlarged, no adnexal masses  Rectal: deferred  Lymphatic: no evidence of inguinal lymphadenopathy Extremities: no edema, erythema, or tenderness Neurologic: Grossly intact Psychiatric: mood appropriate, affect full  Female chaperone present for pelvic and breast  portions of the physical exam    Assessment: 32 y.o. [id number] routine annual exam  Plan: Problem List Items Addressed This Visit    None    Visit Diagnoses    Breast screening       Encounter for gynecological examination without abnormal finding          2) STI screening  was notoffered and therefore not obtained  2)  ASCCP guidelines and rational discussed.  Patient opts for every 3 years screening interval  3) Contraception - the patient is currently using  IUD.  She is happy with her current form of contraception and plans to continue  4) Routine healthcare maintenance including cholesterol, diabetes screening discussed managed by PCP  5) Return in about 6 months (around 03/09/2018) for clinical breast exam.   Malachy Mood, MD, [facility], [facility] 09/07/2017, 12:54 PM

## 2017-10-01 ENCOUNTER — Other Ambulatory Visit: Payer: Self-pay

## 2017-10-01 DIAGNOSIS — Z0189 Encounter for other specified special examinations: Secondary | ICD-10-CM | POA: Diagnosis not present

## 2017-10-01 MED ORDER — SERTRALINE HCL 100 MG PO TABS: 100.0000 mg | ORAL_TABLET | Freq: Every day | ORAL | 4 refills | Status: DC

## 2018-01-29 DIAGNOSIS — M79672 Pain in left foot: Secondary | ICD-10-CM | POA: Diagnosis not present

## 2018-01-29 DIAGNOSIS — M67472 Ganglion, left ankle and foot: Secondary | ICD-10-CM | POA: Diagnosis not present

## 2018-03-11 ENCOUNTER — Encounter: Payer: Self-pay | Admitting: Obstetrics and Gynecology

## 2018-03-11 ENCOUNTER — Ambulatory Visit (INDEPENDENT_AMBULATORY_CARE_PROVIDER_SITE_OTHER): Payer: BLUE CROSS/BLUE SHIELD | Admitting: Obstetrics and Gynecology

## 2018-03-11 VITALS — BP 110/70 | HR 68 | Wt 140.0 lb

## 2018-03-11 DIAGNOSIS — Z1501 Genetic susceptibility to malignant neoplasm of breast: Secondary | ICD-10-CM

## 2018-03-11 DIAGNOSIS — Z1231 Encounter for screening mammogram for malignant neoplasm of breast: Secondary | ICD-10-CM | POA: Diagnosis not present

## 2018-03-11 DIAGNOSIS — E039 Hypothyroidism, unspecified: Secondary | ICD-10-CM | POA: Diagnosis not present

## 2018-03-11 DIAGNOSIS — Z1239 Encounter for other screening for malignant neoplasm of breast: Secondary | ICD-10-CM

## 2018-03-11 MED ORDER — SERTRALINE HCL 100 MG PO TABS: 100.0000 mg | ORAL_TABLET | Freq: Every day | ORAL | 3 refills | Status: DC

## 2018-03-11 MED ORDER — LEVOTHYROXINE SODIUM 75 MCG PO TABS: 75.0000 ug | ORAL_TABLET | Freq: Every day | ORAL | 3 refills | Status: DC

## 2018-03-11 NOTE — Progress Notes (Signed)
Obstetrics & Gynecology Office Visit   Chief Complaint:  Chief Complaint  Patient presents with  . Follow-up    breast exam/routine bloodwork thyroid    History of Present Illness: 32 year old G41P2002 with strong family history of breast cancer, increased lifetime risk of breast cancer based on TC modeling, and BRIP1 gene mutations.  She has not noted any new breast complaints.  Denies discharge, skin changes.     Review of Systems: Review of Systems  Constitutional: Negative.   Genitourinary: Negative.   Skin: Negative.      Past Medical History:  Past Medical History:  Diagnosis Date  . Anxiety   . 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 Family history of breast cancer   . Family history of breast cancer   . Genetic testing of female 11/2016   BRIP1 POSITIVE  . Hypothyroidism   . Increased risk of breast cancer 12/2016   based on family hx-->IBIS=23.9%, riskscore=20.4%    Past Surgical History:  No past surgical history on file.  Gynecologic History: No LMP recorded. (Menstrual status: IUD).  Obstetric History: [id number]  Family History:  Family History  Problem Relation Age of Onset  . Breast cancer Sister 65       triple negative; currently 56  . Hypertension Mother   . Hypothyroidism Mother   . Hypertension Father   . Hypothyroidism Father     Social History:  Social History   Socioeconomic History  . Marital status: Married    Spouse name: Not on file  . Number of children: Not on file  . Years of education: Not on file  . Highest education level: Not on file  Occupational History  . Not on file  Social Needs  . Financial resource strain: Not on file  . Food insecurity:    Worry: Not on file    Inability: Not on file  . Transportation needs:    Medical: Not on file    Non-medical: Not on file  Tobacco Use  . Smoking status: Never Smoker  . Smokeless tobacco: Never Used  Substance  and Sexual Activity  . Alcohol use: No  . Drug use: No  . Sexual activity: Yes    Birth control/protection: IUD  Lifestyle  . Physical activity:    Days per week: 5 days    Minutes per session: 20 min  . Stress: To some extent  Relationships  . Social connections:    Talks on phone: More than three times a week    Gets together: Once a week    Attends religious service: Never    Active member of club or organization: No    Attends meetings of clubs or organizations: Never    Relationship status: Married  . Intimate partner violence:    Fear of current or ex partner: No    Emotionally abused: No    Physically abused: No    Forced sexual activity: No  Other Topics Concern  . Not on file  Social History Narrative  . Not on file    Allergies:  No Known Allergies  Medications: Prior to Admission medications   Medication Sig Start Date End Date Taking? Authorizing Provider  levothyroxine (SYNTHROID, LEVOTHROID) 75 MCG tablet Take 1 tablet (75 mcg total) by mouth daily. 09/06/17 09/06/18  Malachy Mood, MD  sertraline (ZOLOFT) 100 MG tablet Take 1 tablet (100 mg total) by mouth daily. 10/01/17  Malachy Mood, MD    Physical Exam Vitals:  Vitals:   03/11/18 1623  BP: 110/70  Pulse: 68   No LMP recorded. (Menstrual status: IUD).  General: NAD HEENT: normocephalic, anicteric Pulmonary: No increased work of breathing Breast symmetrical, no tenderness, no palpable nodules or masses, no skin or nipple retraction present, no nipple discharge.  No axillary or supraclavicular lymphadenopathy. Neurologic: Grossly intact Psychiatric: mood appropriate, affect full  Female chaperone present for pelvic  portions of the physical exam  Assessment: 32 y.o. [id number] breast surveillance  Plan: Problem List Items Addressed This Visit    None    Visit Diagnoses    Breast screening    -  Primary   Relevant Orders   MM 3D SCREEN BREAST BILATERAL   Breast cancer screening,  high risk patient         1) Breast cancer screening - normal exam today - follow up mammogram ordered  2) A total of 15 minutes were spent in face-to-face contact with the patient during this encounter with over half of that time devoted to counseling and coordination of care.  3) Hypothyroidism - levothyroxine refilled  4) Return in about 6 months (around 09/09/2018) for 6 monts breast exam, 1 year annual.   Malachy Mood, MD, [facility], [place]

## 2018-04-11 ENCOUNTER — Other Ambulatory Visit: Payer: Self-pay | Admitting: Obstetrics and Gynecology

## 2018-04-11 ENCOUNTER — Ambulatory Visit
Admission: RE | Admit: 2018-04-11 | Discharge: 2018-04-11 | Disposition: A | Discharge status: Home / Self Care | Payer: BLUE CROSS/BLUE SHIELD | Source: Ambulatory Visit | Attending: Obstetrics and Gynecology | Admitting: Obstetrics and Gynecology

## 2018-04-11 DIAGNOSIS — N6489 Other specified disorders of breast: Secondary | ICD-10-CM

## 2018-04-11 DIAGNOSIS — Z1239 Encounter for other screening for malignant neoplasm of breast: Secondary | ICD-10-CM | POA: Diagnosis not present

## 2018-04-11 DIAGNOSIS — R928 Other abnormal and inconclusive findings on diagnostic imaging of breast: Secondary | ICD-10-CM

## 2018-04-11 DIAGNOSIS — Z1231 Encounter for screening mammogram for malignant neoplasm of breast: Secondary | ICD-10-CM | POA: Diagnosis not present

## 2018-04-19 ENCOUNTER — Ambulatory Visit
Admission: RE | Admit: 2018-04-19 | Discharge: 2018-04-19 | Disposition: A | Discharge status: Home / Self Care | Payer: BLUE CROSS/BLUE SHIELD | Source: Ambulatory Visit | Attending: Obstetrics and Gynecology | Admitting: Obstetrics and Gynecology

## 2018-04-19 DIAGNOSIS — R928 Other abnormal and inconclusive findings on diagnostic imaging of breast: Secondary | ICD-10-CM | POA: Diagnosis not present

## 2018-04-19 DIAGNOSIS — N6489 Other specified disorders of breast: Secondary | ICD-10-CM | POA: Diagnosis not present

## 2018-04-19 DIAGNOSIS — R922 Inconclusive mammogram: Secondary | ICD-10-CM | POA: Diagnosis not present

## 2018-04-25 DIAGNOSIS — J387 Other diseases of larynx: Secondary | ICD-10-CM | POA: Diagnosis not present

## 2018-04-25 DIAGNOSIS — J029 Acute pharyngitis, unspecified: Secondary | ICD-10-CM | POA: Diagnosis not present

## 2018-07-02 ENCOUNTER — Encounter: Payer: Self-pay | Admitting: Obstetrics and Gynecology

## 2018-07-02 ENCOUNTER — Ambulatory Visit (INDEPENDENT_AMBULATORY_CARE_PROVIDER_SITE_OTHER): Payer: BLUE CROSS/BLUE SHIELD | Admitting: Obstetrics and Gynecology

## 2018-07-02 VITALS — BP 116/80 | HR 80 | Ht 59.0 in | Wt 139.0 lb

## 2018-07-02 DIAGNOSIS — R1031 Right lower quadrant pain: Secondary | ICD-10-CM

## 2018-07-02 DIAGNOSIS — Z30431 Encounter for routine checking of intrauterine contraceptive device: Secondary | ICD-10-CM | POA: Diagnosis not present

## 2018-07-02 DIAGNOSIS — N898 Other specified noninflammatory disorders of vagina: Secondary | ICD-10-CM | POA: Diagnosis not present

## 2018-07-02 LAB — POCT WET PREP WITH KOH
Clue Cells Wet Prep HPF POC: NEGATIVE
KOH Prep POC: NEGATIVE
Trichomonas, UA: NEGATIVE
YEAST WET PREP PER HPF POC: NEGATIVE

## 2018-07-02 MED ORDER — FLUCONAZOLE 150 MG PO TABS: 150.0000 mg | ORAL_TABLET | Freq: Once | ORAL | 0 refills | Status: AC

## 2018-07-02 NOTE — Patient Instructions (Signed)
I value your feedback and entrusting us with your care. If you get a [place] patient survey, I would appreciate you taking the time to let us know about your experience today. Thank you! 

## 2018-07-02 NOTE — Progress Notes (Signed)
[facility], [place]   Chief Complaint  Patient presents with  . Vaginitis    little discharge and itchiness, no odor, irritation going on x a couple days     HPI:      [name] is a 33 y.o. [id number] who LMP was No LMP recorded. (Menstrual status: IUD)., presents today for increased vag d/c with irritation/itch, dysuria, no odor, for several days. Has ext irritation intermittently but sx worse recently, starting after sex. No new soaps/detergents. No dryer sheets, no recent abx use. No meds to treat.  Pt also with intermittent, dull RLQ pain for a couple wks. Pt has had recent nausea/diarrhea but pain sx started before diarrhea. Pt is sex active, has Mirena. No pain/bleeding with sex. Hx of BRIP1 gene mutation with increased risk of ovar cancer. Dr. Georgianne Fick wants to do BSO.    Past Medical History:  Diagnosis Date  . Anxiety   . 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 Family history of breast cancer   . Genetic testing of female 11/2016   BRIP1 POSITIVE  . Hypothyroidism   . Increased risk of breast cancer 12/2016   based on family hx-->IBIS=23.9%, riskscore=20.4%    History reviewed. No pertinent surgical history.  Family History  Problem Relation Age of Onset  . Breast cancer Sister 28       triple negative; currently 69  . Hypothyroidism Sister   . Hypertension Mother   . Hypothyroidism Mother   . Hypertension Father   . Hypothyroidism Father   . Other Maternal Grandfather        myocardial infarction  . Other Paternal Grandfather        myocardial infarction    Social History   Socioeconomic History  . Marital status: Married    Spouse name: Not on file  . Number of children: Not on file  . Years of education: Not on file  . Highest education level: Not on file  Occupational History  . Not on file  Social Needs  . Financial resource strain: Not on file  . Food insecurity:    Worry:  Not on file    Inability: Not on file  . Transportation needs:    Medical: Not on file    Non-medical: Not on file  Tobacco Use  . Smoking status: Never Smoker  . Smokeless tobacco: Never Used  Substance and Sexual Activity  . Alcohol use: No  . Drug use: No  . Sexual activity: Yes    Birth control/protection: I.U.D.    Comment: Mirena  Lifestyle  . Physical activity:    Days per week: 5 days    Minutes per session: 20 min  . Stress: To some extent  Relationships  . Social connections:    Talks on phone: More than three times a week    Gets together: Once a week    Attends religious service: Never    Active member of club or organization: No    Attends meetings of clubs or organizations: Never    Relationship status: Married  . Intimate partner violence:    Fear of current or ex partner: No    Emotionally abused: No    Physically abused: No    Forced sexual activity: No  Other Topics Concern  . Not on file  Social History Narrative  . Not on file    Outpatient Medications Prior to Visit  Medication Sig Dispense Refill  . levonorgestrel (MIRENA) 20 MCG/24HR IUD 1 each by Intrauterine route once.    Marland Kitchen levothyroxine (SYNTHROID, LEVOTHROID) 75 MCG tablet Take 1 tablet (75 mcg total) by mouth daily. 90 tablet 3  . sertraline (ZOLOFT) 100 MG tablet Take 1 tablet (100 mg total) by mouth daily. 90 tablet 3   No facility-administered medications prior to visit.       ROS:  Review of Systems  Constitutional: Negative for fatigue, fever and unexpected weight change.  Respiratory: Negative for cough, shortness of breath and wheezing.   Cardiovascular: Negative for chest pain, palpitations and leg swelling.  Gastrointestinal: Positive for diarrhea and nausea. Negative for blood in stool, constipation and vomiting.  Endocrine: Negative for cold intolerance, heat intolerance and polyuria.  Genitourinary: Positive for dysuria, frequency and vaginal discharge. Negative for  dyspareunia, flank pain, genital sores, hematuria, menstrual problem, pelvic pain, urgency, vaginal bleeding and vaginal pain.  Musculoskeletal: Negative for back pain, joint swelling and myalgias.  Skin: Negative for rash.  Neurological: Positive for headaches. Negative for dizziness, syncope, light-headedness and numbness.  Hematological: Negative for adenopathy.  Psychiatric/Behavioral: Positive for agitation and dysphoric mood. Negative for confusion, sleep disturbance and suicidal ideas. The patient is not nervous/anxious.    BREAST: No symptoms   OBJECTIVE:   Vitals:  BP 116/80   Pulse 80   Ht 4' 11"  (1.499 m)   Wt 139 lb (63 kg)   BMI 28.07 kg/m   Physical Exam Vitals signs reviewed.  Constitutional:      Appearance: She is well-developed.  Pulmonary:     Effort: Pulmonary effort is normal.  Genitourinary:    Pubic Area: No rash.      Labia:        Right: No rash, tenderness or lesion.        Left: No rash, tenderness or lesion.      Vagina: Normal. No vaginal discharge, erythema or tenderness.     Cervix: Normal.     Uterus: Normal. Not enlarged and not tender.      Adnexa: Left adnexa normal.       Right: Tenderness present. No mass.         Left: No mass or tenderness.       Comments: MILD ERYTHEMA AT INTROITUS; IUD STRINGS IN CX OS Musculoskeletal: Normal range of motion.  Neurological:     Mental Status: She is alert and oriented to person, place, and time.  Psychiatric:        Behavior: Behavior normal.        Thought Content: Thought content normal.     Results: Results for orders placed or performed in visit on 07/02/18 (from the past 24 hour(s))  POCT Wet Prep with KOH     Status: Normal   Collection Time: 07/02/18  2:31 PM  Result Value Ref Range   Trichomonas, UA Negative    Clue Cells Wet Prep HPF POC neg    Epithelial Wet Prep HPF POC     Yeast Wet Prep HPF POC neg    Bacteria Wet Prep HPF POC     RBC Wet Prep HPF POC     WBC Wet Prep HPF  POC     KOH Prep POC Negative Negative     Assessment/Plan: Vaginal itching - Mild irritaiton on exam, neg wet prep. Treat empirically with diflucan. F/u prn sx.  - Plan: POCT Wet Prep with KOH, fluconazole (DIFLUCAN) 150 MG tablet  Vaginal discharge -  Plan: POCT Wet Prep with KOH, fluconazole (DIFLUCAN) 150 MG tablet  RLQ abdominal pain - Intermittent, dull for 2 wks. Minimal exam. Check GYN u/s if sx persist in a week. Question role of diarrhea and sx. Hx of BRIP1 gene mutation.  Encounter for routine checking of intrauterine contraceptive device (IUD) - IUD in place.     Meds ordered this encounter  Medications  . fluconazole (DIFLUCAN) 150 MG tablet    Sig: Take 1 tablet (150 mg total) by mouth once for 1 dose.    Dispense:  1 tablet    Refill:  0    Order Specific Question:   Supervising Provider    Answer:   Gae Dry [[id number]]      Return if symptoms worsen or fail to improve.  Sundi Slevin B. Simon Llamas, PA-C 07/02/2018 2:32 PM

## 2018-09-10 ENCOUNTER — Telehealth: Payer: Self-pay | Admitting: Obstetrics and Gynecology

## 2018-09-10 ENCOUNTER — Other Ambulatory Visit: Payer: Self-pay | Admitting: Obstetrics and Gynecology

## 2018-09-10 MED ORDER — SERTRALINE HCL 100 MG PO TABS: 150.0000 mg | ORAL_TABLET | Freq: Every day | ORAL | 3 refills | Status: DC

## 2018-09-10 NOTE — Telephone Encounter (Signed)
Pt would like Dr. Georgianne Fick to increase zoloft due to increased anxiety currently.

## 2018-09-10 NOTE — Telephone Encounter (Signed)
Rx has been sent, let her know to check her mychart messages so she can fill out her PHQ-9 and GAD-7 for me

## 2018-09-10 NOTE — Telephone Encounter (Signed)
Advise

## 2018-09-12 ENCOUNTER — Ambulatory Visit: Payer: BLUE CROSS/BLUE SHIELD | Admitting: Obstetrics and Gynecology

## 2018-10-14 ENCOUNTER — Ambulatory Visit: Payer: BLUE CROSS/BLUE SHIELD | Admitting: Obstetrics and Gynecology

## 2018-11-29 ENCOUNTER — Ambulatory Visit: Payer: BLUE CROSS/BLUE SHIELD | Admitting: Obstetrics and Gynecology

## 2018-12-05 DIAGNOSIS — F411 Generalized anxiety disorder: Secondary | ICD-10-CM | POA: Insufficient documentation

## 2018-12-05 NOTE — Patient Instructions (Signed)
[facility] [address] [place] [address]  [facility]  [address] [place] [address]  Phone: [phone]  

## 2018-12-05 NOTE — Progress Notes (Signed)
Gynecology Annual Exam   PCP: [place]  Chief Complaint: No chief complaint on file.   History of Present Illness: Patient is a 33 y.o. V2N1916 presents for annual exam. The patient has no complaints today.   LMP: No LMP recorded. (Menstrual status: IUD).  The patient is sexually active. She currently uses IUD for contraception. She denies dyspareunia.  The patient does perform self breast exams.  There is notable family history of breast or ovarian cancer in her family.  The patient wears seatbelts: yes.   The patient has regular exercise: not asked.    The patient denies current symptoms of depression.    Review of Systems: Review of Systems  Constitutional: Negative for chills and fever.  HENT: Negative for congestion.   Respiratory: Negative for cough and shortness of breath.   Cardiovascular: Negative for chest pain and palpitations.  Gastrointestinal: Negative for abdominal pain, constipation, diarrhea, heartburn, nausea and vomiting.  Genitourinary: Negative for dysuria, frequency and urgency.  Skin: Negative for itching and rash.  Neurological: Negative for dizziness and headaches.  Endo/Heme/Allergies: Negative for polydipsia.  Psychiatric/Behavioral: Negative for depression.    Past Medical History:  Past Medical History:  Diagnosis Date  . Anxiety   . 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 Family history of breast cancer   . Genetic testing of female 11/2016   BRIP1 POSITIVE  . Hypothyroidism   . Increased risk of breast cancer 12/2016   based on family hx-->IBIS=23.9%, riskscore=20.4%    Past Surgical History:  No past surgical history on file.  Gynecologic History:  No LMP recorded. (Menstrual status: IUD). Contraception: IUD Mirena 05/08/2014 Last Pap: Results were: NIL and HR HPV negative 05/08/2014 Mammogram: 04/19/2018 BI-RAD I  Obstetric History: [id number]  Family  History:  Family History  Problem Relation Age of Onset  . Breast cancer Sister 42       triple negative; currently 44  . Hypothyroidism Sister   . Hypertension Mother   . Hypothyroidism Mother   . Hypertension Father   . Hypothyroidism Father   . Other Maternal Grandfather        myocardial infarction  . Other Paternal Grandfather        myocardial infarction    Social History:  Social History   Socioeconomic History  . Marital status: Married    Spouse name: Not on file  . Number of children: Not on file  . Years of education: Not on file  . Highest education level: Not on file  Occupational History  . Not on file  Social Needs  . Financial resource strain: Not on file  . Food insecurity    Worry: Not on file    Inability: Not on file  . Transportation needs    Medical: Not on file    Non-medical: Not on file  Tobacco Use  . Smoking status: Never Smoker  . Smokeless tobacco: Never Used  Substance and Sexual Activity  . Alcohol use: No  . Drug use: No  . Sexual activity: Yes    Birth control/protection: I.U.D.    Comment: Mirena  Lifestyle  . Physical activity    Days per week: 5 days    Minutes per session: 20 min  . Stress: To some extent  Relationships  . Social connections    Talks on phone: More than three times a week    Gets together: Once a week  Attends religious service: Never    Active member of club or organization: No    Attends meetings of clubs or organizations: Never    Relationship status: Married  . Intimate partner violence    Fear of current or ex partner: No    Emotionally abused: No    Physically abused: No    Forced sexual activity: No  Other Topics Concern  . Not on file  Social History Narrative  . Not on file    Allergies:  No Known Allergies  Medications: Prior to Admission medications   Medication Sig Start Date End Date Taking? Authorizing Provider  levonorgestrel (MIRENA) 20 MCG/24HR IUD 1 each by Intrauterine  route once. 05/08/14   [provider]  levothyroxine (SYNTHROID, LEVOTHROID) 75 MCG tablet Take 1 tablet (75 mcg total) by mouth daily. 03/11/18 03/11/19  Malachy Mood, MD  sertraline (ZOLOFT) 100 MG tablet Take 1.5 tablets (150 mg total) by mouth daily. 09/10/18   Malachy Mood, MD    Physical Exam Vitals: Blood pressure 124/84, pulse 82, height _0  (1.499 m), weight 140 lb (63.5 kg).   General: NAD HEENT: normocephalic, anicteric Thyroid: no enlargement, no palpable nodules Pulmonary: No increased work of breathing, CTAB Cardiovascular: RRR, distal pulses 2+ Breast: Breast symmetrical, no tenderness, no palpable nodules or masses, no skin or nipple retraction present, no nipple discharge.  No axillary or supraclavicular lymphadenopathy. Abdomen: NABS, soft, non-tender, non-distended.  Umbilicus without lesions.  No hepatomegaly, splenomegaly or masses palpable. No evidence of hernia  Genitourinary:  External: Normal external female genitalia.  Normal urethral meatus, normal Bartholin's and Skene's glands.    Vagina: Normal vaginal mucosa, no evidence of prolapse.    Cervix: Grossly normal in appearance, no bleeding, IUD string visualized 2cm  Uterus: Non-enlarged, mobile, normal contour.  No CMT  Adnexa: ovaries non-enlarged, no adnexal masses  Rectal: deferred  Lymphatic: no evidence of inguinal lymphadenopathy Extremities: no edema, erythema, or tenderness Neurologic: Grossly intact Psychiatric: mood appropriate, affect full  Female chaperone present for pelvic and breast  portions of the physical exam  Assessment: 33 y.o. [id number] routine annual exam  Plan: Problem List Items Addressed This Visit      Other   Monoallelic mutation of BRIP1 gene   Family history of breast cancer   Generalized anxiety disorder    Other Visit Diagnoses    Acquired hypothyroidism    -  Primary   Encounter for gynecological examination without abnormal finding       Breast  screening          1) STI screening  was notoffered and therefore not obtained  2)  ASCCP guidelines and rational discussed.  Patient opts for every 3 years screening interval  3) Contraception - the patient is currently using  IUD.  She is happy with her current form of contraception and plans to continue - discussed pros/cons on tubal ligation, ovarian cancer risk reduction.  Recommendation per current expert opinion for BRIP1 mutation is oophorectomy at 67-50 - discussed data for use of Mirena up to 7 years  4) Thyroid screen obtained today  5) No follow-ups on file.   Malachy Mood, MD, [facility], [facility] 12/05/2018, 8:48 AM

## 2018-12-06 ENCOUNTER — Other Ambulatory Visit (HOSPITAL_COMMUNITY)
Admission: RE | Admit: 2018-12-06 | Discharge: 2018-12-06 | Disposition: A | Discharge status: Home / Self Care | Payer: BC Managed Care – PPO | Source: Ambulatory Visit | Attending: Obstetrics and Gynecology | Admitting: Obstetrics and Gynecology

## 2018-12-06 ENCOUNTER — Ambulatory Visit (INDEPENDENT_AMBULATORY_CARE_PROVIDER_SITE_OTHER): Payer: BC Managed Care – PPO | Admitting: Obstetrics and Gynecology

## 2018-12-06 ENCOUNTER — Other Ambulatory Visit: Payer: Self-pay

## 2018-12-06 ENCOUNTER — Encounter: Payer: Self-pay | Admitting: Obstetrics and Gynecology

## 2018-12-06 VITALS — BP 124/84 | HR 82 | Ht 59.0 in | Wt 140.0 lb

## 2018-12-06 DIAGNOSIS — Z124 Encounter for screening for malignant neoplasm of cervix: Secondary | ICD-10-CM

## 2018-12-06 DIAGNOSIS — Z803 Family history of malignant neoplasm of breast: Secondary | ICD-10-CM

## 2018-12-06 DIAGNOSIS — Z01419 Encounter for gynecological examination (general) (routine) without abnormal findings: Secondary | ICD-10-CM

## 2018-12-06 DIAGNOSIS — Z1501 Genetic susceptibility to malignant neoplasm of breast: Secondary | ICD-10-CM

## 2018-12-06 DIAGNOSIS — F411 Generalized anxiety disorder: Secondary | ICD-10-CM

## 2018-12-06 DIAGNOSIS — Z1239 Encounter for other screening for malignant neoplasm of breast: Secondary | ICD-10-CM

## 2018-12-06 DIAGNOSIS — E039 Hypothyroidism, unspecified: Secondary | ICD-10-CM

## 2018-12-06 DIAGNOSIS — Z1589 Genetic susceptibility to other disease: Secondary | ICD-10-CM

## 2018-12-06 MED ORDER — SERTRALINE HCL 100 MG PO TABS: 150.0000 mg | ORAL_TABLET | Freq: Every day | ORAL | 3 refills | Status: DC

## 2018-12-06 MED ORDER — LEVOTHYROXINE SODIUM 75 MCG PO TABS: 75.0000 ug | ORAL_TABLET | Freq: Every day | ORAL | 3 refills | Status: DC

## 2018-12-08 LAB — THYROID PANEL WITH TSH
Free Thyroxine Index: 2 (ref 1.2–4.9)
T3 Uptake Ratio: 29 % (ref 24–39)
T4, Total: 6.8 ug/dL (ref 4.5–12.0)
TSH: 1.77 u[IU]/mL (ref 0.450–4.500)

## 2018-12-10 LAB — CYTOLOGY - PAP
Diagnosis: NEGATIVE
HPV: NOT DETECTED

## 2019-01-27 DIAGNOSIS — M67472 Ganglion, left ankle and foot: Secondary | ICD-10-CM | POA: Diagnosis not present

## 2019-01-27 DIAGNOSIS — M79672 Pain in left foot: Secondary | ICD-10-CM | POA: Diagnosis not present

## 2019-02-26 DIAGNOSIS — M67472 Ganglion, left ankle and foot: Secondary | ICD-10-CM | POA: Diagnosis not present

## 2019-02-26 DIAGNOSIS — M79672 Pain in left foot: Secondary | ICD-10-CM | POA: Diagnosis not present

## 2019-03-31 DIAGNOSIS — G43119 Migraine with aura, intractable, without status migrainosus: Secondary | ICD-10-CM | POA: Diagnosis not present

## 2019-04-14 ENCOUNTER — Ambulatory Visit
Admission: RE | Admit: 2019-04-14 | Discharge: 2019-04-14 | Disposition: A | Discharge status: Home / Self Care | Payer: BC Managed Care – PPO | Source: Ambulatory Visit | Attending: Obstetrics and Gynecology | Admitting: Obstetrics and Gynecology

## 2019-04-14 DIAGNOSIS — Z1501 Genetic susceptibility to malignant neoplasm of breast: Secondary | ICD-10-CM | POA: Diagnosis not present

## 2019-04-14 DIAGNOSIS — Z1589 Genetic susceptibility to other disease: Secondary | ICD-10-CM | POA: Diagnosis not present

## 2019-04-14 DIAGNOSIS — Z1239 Encounter for other screening for malignant neoplasm of breast: Secondary | ICD-10-CM

## 2019-04-14 DIAGNOSIS — Z803 Family history of malignant neoplasm of breast: Secondary | ICD-10-CM | POA: Diagnosis not present

## 2019-04-14 DIAGNOSIS — Z1231 Encounter for screening mammogram for malignant neoplasm of breast: Secondary | ICD-10-CM | POA: Insufficient documentation

## 2019-04-27 ENCOUNTER — Other Ambulatory Visit: Payer: Self-pay | Admitting: Obstetrics and Gynecology

## 2019-04-28 NOTE — Telephone Encounter (Signed)
advise

## 2019-04-29 ENCOUNTER — Telehealth: Payer: Self-pay

## 2019-04-29 ENCOUNTER — Other Ambulatory Visit: Payer: Self-pay | Admitting: Obstetrics and Gynecology

## 2019-04-29 MED ORDER — SERTRALINE HCL 100 MG PO TABS: 150.0000 mg | ORAL_TABLET | Freq: Every day | ORAL | 3 refills | Status: DC

## 2019-04-29 NOTE — Telephone Encounter (Signed)
Its been  sent

## 2019-04-29 NOTE — Telephone Encounter (Signed)
advise

## 2019-04-29 NOTE — Telephone Encounter (Signed)
Pt calling requesting refill on Zoloft to last to her appt on the 20th. Please advise

## 2019-06-30 DIAGNOSIS — H6123 Impacted cerumen, bilateral: Secondary | ICD-10-CM | POA: Diagnosis not present

## 2019-07-15 ENCOUNTER — Ambulatory Visit (INDEPENDENT_AMBULATORY_CARE_PROVIDER_SITE_OTHER): Payer: BC Managed Care – PPO | Admitting: Obstetrics and Gynecology

## 2019-07-15 ENCOUNTER — Other Ambulatory Visit: Payer: Self-pay

## 2019-07-15 ENCOUNTER — Encounter: Payer: Self-pay | Admitting: Obstetrics and Gynecology

## 2019-07-15 VITALS — BP 120/80 | Ht 59.0 in | Wt 143.0 lb

## 2019-07-15 DIAGNOSIS — E039 Hypothyroidism, unspecified: Secondary | ICD-10-CM

## 2019-07-15 DIAGNOSIS — N643 Galactorrhea not associated with childbirth: Secondary | ICD-10-CM

## 2019-07-15 NOTE — Patient Instructions (Signed)
I value your feedback and entrusting us with your care. If you get a [facility] patient survey, I would appreciate you taking the time to let us know about your experience today. Thank you!  As of May 29, 2019, your lab results will be released to your MyChart immediately, before I even have a chance to see them. Please give me time to review them and contact you if there are any abnormalities. Thank you for your patience.  

## 2019-07-15 NOTE — Progress Notes (Addendum)
[facility], [place]   Chief Complaint  Patient presents with  . Breast exam    dripping white fluid of left breast only, both breasts are tender    HPI:      [name] is a 34 y.o. [id number] who LMP was No LMP recorded. (Menstrual status: IUD)., presents today for milky nipple d/c bilat for the past few wks. No bleeding/brown d/c. Usually with manipulation but initially just started dripping LT breast. No masses. Has bilat breast tenderness, with 1 caffeine drink daily. Is amenorrheic except occas spotting with IUD. Pt with hypothyroidism, last checked 6/20. Pt on zoloft 150 mg daily, dose increased almost a yr ago. Normal mammo 04/14/19 Has FH breast and ovar cancer. Pt is BRIP1 positive (ovarian cancer risk).  Patient Active Problem List   Diagnosis Date Noted  . Galactorrhea 07/15/2019  . Acquired hypothyroidism 07/15/2019  . Generalized anxiety disorder 12/05/2018  . Family history of breast cancer   . Monoallelic mutation of BRIP1 gene 04/02/2017    History reviewed. No pertinent surgical history.  Family History  Problem Relation Age of Onset  . Breast cancer Sister 34       triple negative; currently 18  . Hypothyroidism Sister   . Hypertension Mother   . Hypothyroidism Mother   . Stroke Mother   . Hypertension Father   . Hypothyroidism Father   . Other Maternal Grandfather        myocardial infarction  . Other Paternal Grandfather        myocardial infarction  . Colon cancer Niece 53    Social History   Socioeconomic History  . Marital status: Married    Spouse name: Not on file  . Number of children: Not on file  . Years of education: Not on file  . Highest education level: Not on file  Occupational History  . Not on file  Tobacco Use  . Smoking status: Never Smoker  . Smokeless tobacco: Never Used  Substance and Sexual Activity  . Alcohol use: No  . Drug use: No  . Sexual activity: Yes    Birth control/protection: I.U.D.   Comment: Mirena  Other Topics Concern  . Not on file  Social History Narrative  . Not on file   Social Determinants of Health   Financial Resource Strain:   . Difficulty of Paying Living Expenses: Not on file  Food Insecurity:   . Worried About Charity fundraiser in the Last Year: Not on file  . Ran Out of Food in the Last Year: Not on file  Transportation Needs:   . Lack of Transportation (Medical): Not on file  . Lack of Transportation (Non-Medical): Not on file  Physical Activity:   . Days of Exercise per Week: Not on file  . Minutes of Exercise per Session: Not on file  Stress:   . Feeling of Stress : Not on file  Social Connections:   . Frequency of Communication with Friends and Family: Not on file  . Frequency of Social Gatherings with Friends and Family: Not on file  . Attends Religious Services: Not on file  . Active Member of Clubs or Organizations: Not on file  . Attends Archivist Meetings: Not on file  . Marital Status: Not on file  Intimate Partner Violence:   . Fear of Current or Ex-Partner: Not on file  . Emotionally Abused: Not on file  . Physically Abused: Not on file  .  Sexually Abused: Not on file    Outpatient Medications Prior to Visit  Medication Sig Dispense Refill  . levonorgestrel (MIRENA) 20 MCG/24HR IUD 1 each by Intrauterine route once.    Marland Kitchen levothyroxine (SYNTHROID) 75 MCG tablet Take 1 tablet (75 mcg total) by mouth daily. 90 tablet 3  . sertraline (ZOLOFT) 100 MG tablet Take 1.5 tablets (150 mg total) by mouth daily. 135 tablet 3   No facility-administered medications prior to visit.      ROS:  Review of Systems  Constitutional: Negative for fatigue, fever and unexpected weight change.  Respiratory: Negative for cough, shortness of breath and wheezing.   Cardiovascular: Negative for chest pain, palpitations and leg swelling.  Gastrointestinal: Negative for blood in stool, constipation, diarrhea, nausea and vomiting.    Endocrine: Negative for cold intolerance, heat intolerance and polyuria.  Genitourinary: Negative for dyspareunia, dysuria, flank pain, frequency, genital sores, hematuria, menstrual problem, pelvic pain, urgency, vaginal bleeding, vaginal discharge and vaginal pain.  Musculoskeletal: Negative for back pain, joint swelling and myalgias.  Skin: Negative for rash.  Neurological: Positive for headaches. Negative for dizziness, syncope, light-headedness and numbness.  Hematological: Negative for adenopathy.  Psychiatric/Behavioral: Negative for agitation, confusion, sleep disturbance and suicidal ideas. The patient is not nervous/anxious.    BREAST: nipple d/c  OBJECTIVE:   Vitals:  BP 120/80   Ht 4' 11"  (1.499 m)   Wt 143 lb (64.9 kg)   BMI 28.88 kg/m   Physical Exam Vitals reviewed.  Pulmonary:     Effort: Pulmonary effort is normal.  Chest:     Breasts: Breasts are symmetrical.        Right: No inverted nipple, mass, nipple discharge, skin change or tenderness.        Left: No inverted nipple, mass, nipple discharge, skin change or tenderness.     Comments: NO NIPPLE D/C WITH MANIPULATION BILAT Musculoskeletal:        General: Normal range of motion.     Cervical back: Normal range of motion.  Skin:    General: Skin is warm and dry.  Neurological:     General: No focal deficit present.     Mental Status: She is alert and oriented to person, place, and time.     Cranial Nerves: No cranial nerve deficit.  Psychiatric:        Mood and Affect: Mood normal.        Behavior: Behavior normal.        Thought Content: Thought content normal.        Judgment: Judgment normal.     Assessment/Plan: Galactorrhea - Plan: Prolactin, TSH + free T4; Bilat breasts for a couple wks. Neg exam today. Check labs. If neg, reassurance. Could also be due to zoloft dose. F/u prn.  Acquired hypothyroidism - Plan: TSH + free T4    Return if symptoms worsen or fail to improve.  Donn Wilmot B.  Kemoni Quesenberry, PA-C 07/15/2019 4:45 PM

## 2019-07-16 LAB — PROLACTIN: Prolactin: 26.7 ng/mL — ABNORMAL HIGH (ref 4.8–23.3)

## 2019-07-16 LAB — TSH+FREE T4
Free T4: 1.36 ng/dL (ref 0.82–1.77)
TSH: 1.8 u[IU]/mL (ref 0.450–4.500)

## 2019-08-01 DIAGNOSIS — N3000 Acute cystitis without hematuria: Secondary | ICD-10-CM | POA: Diagnosis not present

## 2019-12-15 ENCOUNTER — Ambulatory Visit: Payer: BC Managed Care – PPO | Admitting: Obstetrics and Gynecology

## 2020-01-06 ENCOUNTER — Other Ambulatory Visit: Payer: Self-pay

## 2020-01-06 ENCOUNTER — Ambulatory Visit (INDEPENDENT_AMBULATORY_CARE_PROVIDER_SITE_OTHER): Payer: BC Managed Care – PPO | Admitting: Obstetrics and Gynecology

## 2020-01-06 ENCOUNTER — Encounter: Payer: Self-pay | Admitting: Obstetrics and Gynecology

## 2020-01-06 VITALS — BP 130/80 | Ht 59.0 in | Wt 147.0 lb

## 2020-01-06 DIAGNOSIS — E039 Hypothyroidism, unspecified: Secondary | ICD-10-CM

## 2020-01-06 DIAGNOSIS — Z30431 Encounter for routine checking of intrauterine contraceptive device: Secondary | ICD-10-CM

## 2020-01-06 DIAGNOSIS — Z1501 Genetic susceptibility to malignant neoplasm of breast: Secondary | ICD-10-CM

## 2020-01-06 DIAGNOSIS — Z1239 Encounter for other screening for malignant neoplasm of breast: Secondary | ICD-10-CM | POA: Diagnosis not present

## 2020-01-06 DIAGNOSIS — Z803 Family history of malignant neoplasm of breast: Secondary | ICD-10-CM

## 2020-01-06 DIAGNOSIS — F32A Depression, unspecified: Secondary | ICD-10-CM

## 2020-01-06 DIAGNOSIS — F329 Major depressive disorder, single episode, unspecified: Secondary | ICD-10-CM

## 2020-01-06 DIAGNOSIS — Z01419 Encounter for gynecological examination (general) (routine) without abnormal findings: Secondary | ICD-10-CM | POA: Diagnosis not present

## 2020-01-06 DIAGNOSIS — F419 Anxiety disorder, unspecified: Secondary | ICD-10-CM

## 2020-01-06 DIAGNOSIS — Z1589 Genetic susceptibility to other disease: Secondary | ICD-10-CM

## 2020-01-06 MED ORDER — SERTRALINE HCL 100 MG PO TABS: 150.0000 mg | ORAL_TABLET | Freq: Every day | ORAL | 3 refills | Status: DC

## 2020-01-06 MED ORDER — LEVOTHYROXINE SODIUM 75 MCG PO TABS: 75.0000 ug | ORAL_TABLET | Freq: Every day | ORAL | 3 refills | Status: DC

## 2020-01-06 NOTE — Patient Instructions (Signed)
[facility] [address] [place] [address]  [facility]  [address] [place] [address]  Phone: [phone]  

## 2020-01-06 NOTE — Progress Notes (Signed)
Gynecology Annual Exam   PCP: [facility], [place]  Chief Complaint:  Chief Complaint  Patient presents with  . Gynecologic Exam    History of Present Illness: Patient is a 34 y.o. Tara Osborn presents for annual exam. The patient has no complaints today.   LMP: No LMP recorded. (Menstrual status: IUD). Absent on MIrena IUD  The patient is sexually active. She currently uses IUD for contraception. She denies dyspareunia.  The patient does perform self breast exams.  There is notable family history of breast or ovarian cancer in her family, patient with genetic testing showing BRIP1 mutation.  The patient wears seatbelts: yes.   The patient has regular exercise: not asked.    The patient denies current symptoms of depression.    Review of Systems: Review of Systems  Constitutional: Negative for chills and fever.  HENT: Negative for congestion.   Respiratory: Negative for cough and shortness of breath.   Cardiovascular: Negative for chest pain and palpitations.  Gastrointestinal: Negative for abdominal pain, constipation, diarrhea, heartburn, nausea and vomiting.  Genitourinary: Negative for dysuria, frequency and urgency.  Skin: Negative for itching and rash.  Neurological: Negative for dizziness and headaches.  Endo/Heme/Allergies: Negative for polydipsia.  Psychiatric/Behavioral: Negative for depression.    Past Medical History:  Patient Active Problem List   Diagnosis Date Noted  . Galactorrhea 07/15/2019  . Acquired hypothyroidism 07/15/2019  . Generalized anxiety disorder 12/05/2018  . Family history of breast cancer   . Monoallelic mutation of BRIP1 gene 04/02/2017    Sister breast cancer at age 53 [facility] 01/07/2017 negative for BRCA, heterozygote positive for BRIP1 c73C>T (p.Gln25*) clinically significant mutation (High Risk Ovarian, Elevated Risk Breast Cancer) TC model breast cancer risk lifetime 20.4%, 5 year risk 0.4% Ovarian Cancer risk 5.8% to age 16  as oppsed to 1.0% for general population  Consider BSO age 36-50%      Past Surgical History:  History reviewed. No pertinent surgical history.  Gynecologic History:  No LMP recorded. (Menstrual status: IUD). Contraception:05/08/2014 Mirena IUD Last Pap: Results were: 12/06/2018 NIL and HR HPV negative  Mammogram 04/14/2019 BI-RAD I  Obstetric History: [id number]  Family History:  Family History  Problem Relation Age of Onset  . Breast cancer Sister 69       triple negative; currently 10  . Hypothyroidism Sister   . Hypertension Mother   . Hypothyroidism Mother   . Stroke Mother   . Hypertension Father   . Hypothyroidism Father   . Other Maternal Grandfather        myocardial infarction  . Other Paternal Grandfather        myocardial infarction  . Colon cancer Niece 14    Social History:  Social History   Socioeconomic History  . Marital status: Married    Spouse name: Not on file  . Number of children: Not on file  . Years of education: Not on file  . Highest education level: Not on file  Occupational History  . Not on file  Tobacco Use  . Smoking status: Never Smoker  . Smokeless tobacco: Never Used  Vaping Use  . Vaping Use: Never used  Substance and Sexual Activity  . Alcohol use: No  . Drug use: No  . Sexual activity: Yes    Birth control/protection: I.U.D.    Comment: Mirena  Other Topics Concern  . Not on file  Social History Narrative  . Not on file   Social Determinants of Health  Financial Resource Strain:   . Difficulty of Paying Living Expenses:   Food Insecurity:   . Worried About Charity fundraiser in the Last Year:   . Arboriculturist in the Last Year:   Transportation Needs:   . Film/video editor (Medical):   Marland Kitchen Lack of Transportation (Non-Medical):   Physical Activity:   . Days of Exercise per Week:   . Minutes of Exercise per Session:   Stress:   . Feeling of Stress :   Social Connections:   . Frequency of Communication  with Friends and Family:   . Frequency of Social Gatherings with Friends and Family:   . Attends Religious Services:   . Active Member of Clubs or Organizations:   . Attends Archivist Meetings:   Marland Kitchen Marital Status:   Intimate Partner Violence:   . Fear of Current or Ex-Partner:   . Emotionally Abused:   Marland Kitchen Physically Abused:   . Sexually Abused:     Allergies:  No Known Allergies  Medications: Prior to Admission medications   Medication Sig Start Date End Date Taking? Authorizing Provider  levonorgestrel (MIRENA) 20 MCG/24HR IUD 1 each by Intrauterine route once. 05/08/14  Yes [provider]  sertraline (ZOLOFT) 100 MG tablet Take 1.5 tablets (150 mg total) by mouth daily. 04/29/19  Yes Malachy Mood, MD  levothyroxine (SYNTHROID) 75 MCG tablet Take 1 tablet (75 mcg total) by mouth daily. 12/06/18 12/06/19  Malachy Mood, MD    Physical Exam Vitals: Blood pressure 130/80, height 4' 11" (1.499 m), weight 147 lb (66.7 kg).  General: NAD HEENT: normocephalic, anicteric Thyroid: no enlargement, no palpable nodules Pulmonary: No increased work of breathing, CTAB Cardiovascular: RRR, distal pulses 2+ Breast: Breast symmetrical, no tenderness, no palpable nodules or masses, no skin or nipple retraction present, no nipple discharge.  No axillary or supraclavicular lymphadenopathy. Abdomen: NABS, soft, non-tender, non-distended.  Umbilicus without lesions.  No hepatomegaly, splenomegaly or masses palpable. No evidence of hernia  Genitourinary:  External: Normal external female genitalia.  Normal urethral meatus, normal Bartholin's and Skene's glands.    Vagina: Normal vaginal mucosa, no evidence of prolapse.    Cervix: Grossly normal in appearance, no bleeding.  IUD strings visualized  Uterus: Non-enlarged, mobile, normal contour.  No CMT  Adnexa: ovaries non-enlarged, no adnexal masses  Rectal: deferred  Lymphatic: no evidence of inguinal  lymphadenopathy Extremities: no edema, erythema, or tenderness Neurologic: Grossly intact Psychiatric: mood appropriate, affect full  Female chaperone present for pelvic and breast  portions of the physical exam  GAD 7 : Generalized Anxiety Score 01/06/2020 09/06/2017  Nervous, Anxious, on Edge 1 1  Control/stop worrying 1 0  Worry too much - different things 0 0  Trouble relaxing 1 1  Restless 0 1  Easily annoyed or irritable 1 1  Afraid - awful might happen 0 0  Total GAD 7 Score 4 4  Anxiety Difficulty Not difficult at all Not difficult at all    Depression screen [facility] 2/9 01/06/2020  Decreased Interest 0  Down, Depressed, Hopeless 0  PHQ - 2 Score 0  Altered sleeping 2  Tired, decreased energy 2  Change in appetite 0  Feeling bad or failure about yourself  1  Trouble concentrating 0  Moving slowly or fidgety/restless 0  Suicidal thoughts 0  PHQ-9 Score 5  Difficult doing work/chores -    Assessment: 34 y.o. [id number] routine annual exam  Plan: Problem List Items Addressed This Visit  Endocrine   Acquired hypothyroidism   Relevant Medications   levothyroxine (SYNTHROID) 75 MCG tablet     Other   Monoallelic mutation of BRIP1 gene   Relevant Orders   MM 3D SCREEN BREAST BILATERAL   Family history of breast cancer   Relevant Orders   MM 3D SCREEN BREAST BILATERAL    Other Visit Diagnoses    Breast screening    -  Primary   Relevant Orders   MM 3D SCREEN BREAST BILATERAL   Encounter for gynecological examination without abnormal finding       IUD check up       Anxiety and depression       Relevant Medications   sertraline (ZOLOFT) 100 MG tablet      1) STI screening  was notoffered and therefore not obtained  2)  ASCCP guidelines and rational discussed.  Patient opts for every 3 years screening interval  3) Contraception - the patient is currently using  IUD.  She is happy with her current form of contraception and plans to continue  - discussed  pros/cons on tubal ligation, ovarian cancer risk reduction.  Recommendation per current expert opinion for BRIP1 mutation is oophorectomy at 45-50, increased lifetime risk of ovarian cancer of about 10%, insufficient data on breast cancer risk but confers slightly increased risk.   - discussed data for use of Mirena up to 7 years with FDA approval at 6 years  4) Routine healthcare maintenance including cholesterol, diabetes screening discussed managed by PCP  - Thyroid function stable 06/2019  5) Anxiety Depression - continue Zoloft 116m daily  5) Return in about 1 year (around 01/05/2021) for annual.   AMalachy Mood MD, [facility] 01/06/2020, 3:01 PM

## 2020-09-29 ENCOUNTER — Other Ambulatory Visit: Payer: Self-pay | Admitting: Obstetrics and Gynecology

## 2020-09-29 ENCOUNTER — Telehealth: Payer: Self-pay

## 2020-09-29 MED ORDER — SERTRALINE HCL 100 MG PO TABS: 150.0000 mg | ORAL_TABLET | Freq: Every day | ORAL | 1 refills | Status: DC

## 2020-09-29 NOTE — Telephone Encounter (Signed)
Pt calling for refill of zoloft to be sent to [facility] in [place], [place].  [phone]

## 2020-09-29 NOTE — Telephone Encounter (Signed)
Sent!

## 2020-09-29 NOTE — Telephone Encounter (Signed)
Pt aware by detailed msg left.

## 2020-11-07 ENCOUNTER — Other Ambulatory Visit: Payer: Self-pay | Admitting: Obstetrics and Gynecology

## 2020-11-08 ENCOUNTER — Other Ambulatory Visit: Payer: Self-pay | Admitting: Obstetrics and Gynecology

## 2020-11-09 ENCOUNTER — Other Ambulatory Visit: Payer: Self-pay | Admitting: Obstetrics and Gynecology

## 2021-01-06 ENCOUNTER — Other Ambulatory Visit: Payer: Self-pay

## 2021-01-06 ENCOUNTER — Encounter: Payer: Self-pay | Admitting: Obstetrics and Gynecology

## 2021-01-06 ENCOUNTER — Ambulatory Visit (INDEPENDENT_AMBULATORY_CARE_PROVIDER_SITE_OTHER): Payer: BC Managed Care – PPO | Admitting: Obstetrics and Gynecology

## 2021-01-06 VITALS — BP 110/70 | Ht 59.0 in | Wt 147.0 lb

## 2021-01-06 DIAGNOSIS — Z01419 Encounter for gynecological examination (general) (routine) without abnormal findings: Secondary | ICD-10-CM

## 2021-01-06 DIAGNOSIS — Z1239 Encounter for other screening for malignant neoplasm of breast: Secondary | ICD-10-CM

## 2021-01-06 DIAGNOSIS — E039 Hypothyroidism, unspecified: Secondary | ICD-10-CM

## 2021-01-06 DIAGNOSIS — Z1501 Genetic susceptibility to malignant neoplasm of breast: Secondary | ICD-10-CM

## 2021-01-06 DIAGNOSIS — Z9189 Other specified personal risk factors, not elsewhere classified: Secondary | ICD-10-CM

## 2021-01-06 DIAGNOSIS — Z1589 Genetic susceptibility to other disease: Secondary | ICD-10-CM

## 2021-01-06 MED ORDER — LEVOTHYROXINE SODIUM 75 MCG PO TABS: 75.0000 ug | ORAL_TABLET | Freq: Every day | ORAL | 3 refills | Status: DC

## 2021-01-06 MED ORDER — SERTRALINE HCL 100 MG PO TABS: 150.0000 mg | ORAL_TABLET | Freq: Every day | ORAL | 3 refills | Status: DC

## 2021-01-06 NOTE — Patient Instructions (Signed)
[facility] [address] [place] [address]  [facility] [place]  [address] [place] [address]  Phone: [phone]  

## 2021-01-06 NOTE — Progress Notes (Signed)
Gynecology Annual Exam   PCP: [place]  Chief Complaint: No chief complaint on file.   History of Present Illness: Patient is a 35 y.o. [id number] presents for annual exam. The patient has no complaints today.   LMP: No LMP recorded. (Menstrual status: IUD). Ammenorhea secondary to IUD  The patient is sexually active. She currently uses IUD for contraception. She denies dyspareunia.  The patient does perform self breast exams.  There is notable family history of breast or ovarian cancer in her family.  The patient wears seatbelts: yes.   The patient has regular exercise: not asked.    The patient denies current symptoms of depression.    Review of Systems: Review of Systems  Constitutional:  Negative for chills and fever.  HENT:  Negative for congestion.   Respiratory:  Negative for cough and shortness of breath.   Cardiovascular:  Negative for chest pain and palpitations.  Gastrointestinal:  Negative for abdominal pain, constipation, diarrhea, heartburn, nausea and vomiting.  Genitourinary:  Negative for dysuria, frequency and urgency.  Skin:  Negative for itching and rash.  Neurological:  Negative for dizziness and headaches.  Endo/Heme/Allergies:  Negative for polydipsia.  Psychiatric/Behavioral:  Negative for depression.    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  Gynecologic History:  No LMP recorded. (Menstrual status: IUD). Contraception: 05/08/2014 Mirena IUD Last Pap: Results were: 12/06/2018 NIL and HR HPV negative   Obstetric History: G2P2002  Family History:  Family History  Problem Relation Age of Onset   Breast cancer Sister 54       triple negative; currently 11   Hypothyroidism Sister    Hypertension Mother    Hypothyroidism Mother    Stroke Mother    Hypertension Father    Hypothyroidism Father    Other Maternal Grandfather        myocardial infarction   Other Paternal Grandfather        myocardial infarction   Colon cancer Niece 78    Social History:  Social History   Socioeconomic History   Marital status: Married    Spouse name: Not on file   Number of children: Not on file   Years of education: Not on file   Highest education level: Not on file  Occupational History   Not on file  Tobacco Use   Smoking status: Never   Smokeless tobacco: Never  Vaping Use   Vaping Use: Never used  Substance and Sexual Activity   Alcohol use: No   Drug use: No   Sexual activity: Yes    Birth control/protection: I.U.D.    Comment: Mirena  Other Topics Concern   Not on file  Social History Narrative   Not on file   Social Determinants of Health   Financial Resource Strain: Not on file  Food Insecurity: Not on file  Transportation Needs: Not on file  Physical Activity: Not on file  Stress: Not on file  Social Connections: Not on file  Intimate Partner Violence: Not on file    Allergies:  No Known Allergies  Medications: Prior to Admission medications   Medication Sig Start Date End Date Taking? Authorizing Provider  levonorgestrel (MIRENA) 20 MCG/24HR IUD 1 each by Intrauterine route once. 05/08/14   [provider]  levothyroxine (SYNTHROID) 75 MCG tablet Take 1 tablet (75 mcg total) by mouth daily. 01/06/20 01/05/21  Malachy Mood,  MD  sertraline (ZOLOFT) 100 MG tablet TAKE 1 AND 1/2 TABLET BY MOUTH EVERY DAY 11/09/20   Malachy Mood, MD    Physical Exam Vitals: Blood pressure 110/70, height 4' 11"  (1.499 m), weight 147 lb (66.7 kg).  General: NAD HEENT: normocephalic, anicteric Thyroid: no enlargement, no palpable nodules Pulmonary: No increased work of breathing, CTAB Cardiovascular: RRR, distal pulses 2+ Breast: Breast symmetrical, no tenderness, no palpable nodules or masses, no skin or nipple retraction present, no nipple discharge.  No axillary or supraclavicular lymphadenopathy. Abdomen: NABS, soft, non-tender, non-distended.  Umbilicus without lesions.  No hepatomegaly, splenomegaly or masses palpable. No evidence of hernia  Genitourinary:  External: Normal external female genitalia.  Normal urethral meatus, normal Bartholin's and Skene's glands.    Vagina: Normal vaginal mucosa, no evidence of prolapse.    Cervix: Grossly normal in appearance, no bleeding  Uterus: Non-enlarged, mobile, normal contour.  No CMT  Adnexa: ovaries non-enlarged, no adnexal masses  Rectal: deferred  Lymphatic: no evidence of inguinal lymphadenopathy Extremities: no edema, erythema, or tenderness Neurologic: Grossly intact Psychiatric: mood appropriate, affect full  Female chaperone present for pelvic and breast  portions of the physical exam    Assessment: 35 y.o. [id number] routine annual exam  Plan: Problem List Items Addressed This Visit   None   1) STI screening  was notoffered and therefore not obtained  2)  ASCCP guidelines and rational discussed.  Patient opts for every 3 years screening interval  3) Contraception - the patient is currently using  IUD.  She is happy with her current form of contraception and plans to continue - discussed pros/cons on tubal ligation, ovarian cancer risk reduction.  Recommendation per current expert opinion for BRIP1 mutation is oophorectomy at 45-50, increased lifetime risk of  ovarian cancer of about 10%, insufficient data on breast cancer risk but confers slightly increased risk triple negative breast cancers.   - discussed data for use of Mirena up to 7 years   4) Routine healthcare maintenance including cholesterol, diabetes screening discussed managed by PCP  5) Return in about 1 year (around 01/06/2022) for 1 year annual, 4-5 months IUD removal and replacement .   Malachy Mood, MD, [facility], [employer] [facility] 01/06/2021, 3:20 PM

## 2021-01-07 LAB — TSH: TSH: 2.81 u[IU]/mL (ref 0.450–4.500)

## 2021-01-07 LAB — T4, FREE: Free T4: 1.26 ng/dL (ref 0.82–1.77)

## 2021-01-30 ENCOUNTER — Other Ambulatory Visit: Payer: Self-pay | Admitting: Obstetrics and Gynecology

## 2021-02-01 ENCOUNTER — Other Ambulatory Visit: Payer: Self-pay

## 2021-02-01 ENCOUNTER — Ambulatory Visit
Admission: RE | Admit: 2021-02-01 | Discharge: 2021-02-01 | Disposition: A | Discharge status: Home / Self Care | Payer: BC Managed Care – PPO | Source: Ambulatory Visit | Attending: Obstetrics and Gynecology | Admitting: Obstetrics and Gynecology

## 2021-02-01 DIAGNOSIS — Z1501 Genetic susceptibility to malignant neoplasm of breast: Secondary | ICD-10-CM | POA: Diagnosis not present

## 2021-02-01 DIAGNOSIS — Z9189 Other specified personal risk factors, not elsewhere classified: Secondary | ICD-10-CM | POA: Diagnosis not present

## 2021-02-01 DIAGNOSIS — Z1589 Genetic susceptibility to other disease: Secondary | ICD-10-CM | POA: Insufficient documentation

## 2021-02-01 DIAGNOSIS — Z1231 Encounter for screening mammogram for malignant neoplasm of breast: Secondary | ICD-10-CM | POA: Insufficient documentation

## 2021-02-01 DIAGNOSIS — Z1239 Encounter for other screening for malignant neoplasm of breast: Secondary | ICD-10-CM

## 2021-04-10 ENCOUNTER — Other Ambulatory Visit: Payer: Self-pay | Admitting: Obstetrics and Gynecology

## 2021-05-10 ENCOUNTER — Ambulatory Visit: Payer: BC Managed Care – PPO | Admitting: Obstetrics and Gynecology

## 2021-07-06 DIAGNOSIS — R42 Dizziness and giddiness: Secondary | ICD-10-CM | POA: Diagnosis not present

## 2021-09-26 DIAGNOSIS — R109 Unspecified abdominal pain: Secondary | ICD-10-CM | POA: Diagnosis not present

## 2021-10-21 DIAGNOSIS — H6121 Impacted cerumen, right ear: Secondary | ICD-10-CM | POA: Diagnosis not present

## 2021-10-21 DIAGNOSIS — H9192 Unspecified hearing loss, left ear: Secondary | ICD-10-CM | POA: Diagnosis not present

## 2021-12-20 DIAGNOSIS — S93422A Sprain of deltoid ligament of left ankle, initial encounter: Secondary | ICD-10-CM | POA: Diagnosis not present

## 2022-01-10 DIAGNOSIS — S93402D Sprain of unspecified ligament of left ankle, subsequent encounter: Secondary | ICD-10-CM | POA: Diagnosis not present

## 2022-01-26 ENCOUNTER — Telehealth: Payer: Self-pay

## 2022-01-26 ENCOUNTER — Other Ambulatory Visit: Payer: Self-pay | Admitting: Obstetrics and Gynecology

## 2022-01-26 MED ORDER — LEVOTHYROXINE SODIUM 75 MCG PO TABS: 75.0000 ug | ORAL_TABLET | Freq: Every day | ORAL | 0 refills | Status: DC

## 2022-01-26 NOTE — Progress Notes (Signed)
Rx RF levo till 9/23 annual, labs due then.

## 2022-01-26 NOTE — Telephone Encounter (Signed)
Fax refill request received for Levothyroxine 45mg. Patient last seen by Dr. SGeorgianne Fick7/21/22. Next apt with Alicia Copland 95/45/62

## 2022-01-26 NOTE — Telephone Encounter (Signed)
Rx RF eRxd.  

## 2022-03-08 ENCOUNTER — Encounter: Payer: Self-pay | Admitting: Obstetrics and Gynecology

## 2022-03-08 DIAGNOSIS — Z9189 Other specified personal risk factors, not elsewhere classified: Secondary | ICD-10-CM | POA: Insufficient documentation

## 2022-03-08 NOTE — Progress Notes (Unsigned)
PCP:  [place]   No chief complaint on file.    HPI:      Tara Osborn is a 36 y.o. [id number] whose LMP was No LMP recorded. (Menstrual status: IUD)., presents today for her annual examination.  Her menses are absent with Mirena IUD  Sex activity: single partner, contraception - IUD. Mirena placed 05/08/14 Last Pap: 12/06/18 Results were: no abnormalities /neg HPV DNA  Hx of STDs: {STD hx:14358}  Last mammogram: 02/01/21  Results were: normal--routine follow-up in 12 months There is a FH of breast cancer in her sister. Pt is BRCA neg but BRIP1 heterozygous positive, done on [facility] 2018. IBIS=23.9%, riskscore=20.4%. There is no FH of ovarian cancer. The patient {does:18564} do self-breast exams.   Tobacco use: {tob:20664} Alcohol use: {Alcohol:11675} No drug use.  Exercise: {exercise:31265}  She {does:18564} get adequate calcium and Vitamin D in her diet.  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  Family History  Problem Relation Age of Onset   Breast cancer Sister 36       triple negative; currently 53   Hypothyroidism Sister    Hypertension Mother    Hypothyroidism Mother    Stroke Mother    Hypertension Father    Hypothyroidism Father    Other Maternal Grandfather        myocardial infarction   Other Paternal Grandfather        myocardial infarction   Colon cancer Niece 33    Social History   Socioeconomic History   Marital status: Married    Spouse name: Not on file   Number of children: Not on file   Years of education: Not on file   Highest education level: Not on file  Occupational History   Not on file  Tobacco Use   Smoking status: Never   Smokeless tobacco: Never  Vaping Use   Vaping  Use: Never used  Substance and Sexual Activity   Alcohol use: No   Drug use: No   Sexual activity: Yes    Birth control/protection: I.U.D.    Comment: Mirena  Other Topics Concern   Not on file  Social History Narrative   Not on file   Social Determinants of Health   Financial Resource Strain: Not on file  Food Insecurity: Not on file  Transportation Needs: Not on file  Physical Activity: Insufficiently Active (09/06/2017)   Exercise Vital Sign    Days of Exercise per Week: 5 days    Minutes of Exercise per Session: 20 min  Stress: Stress Concern Present (09/06/2017)   [place]    Feeling of Stress : To some extent  Social Connections: Somewhat Isolated (09/06/2017)   Social Connection and Isolation Panel [NHANES]    Frequency of Communication with Friends and Family: More than three times a week    Frequency of Social Gatherings with Friends and Family: Once a week    Attends Religious Services: Never    Marine scientist or Organizations: No    Attends Archivist Meetings: Never    Marital Status: Married  Human resources officer Violence: Not At Risk (09/06/2017)   Humiliation, Afraid, Rape, and Kick questionnaire  Fear of Current or Ex-Partner: No    Emotionally Abused: No    Physically Abused: No    Sexually Abused: No     Current Outpatient Medications:    levonorgestrel (MIRENA) 20 MCG/24HR IUD, 1 each by Intrauterine route once., Disp: , Rfl:    levothyroxine (SYNTHROID) 75 MCG tablet, Take 1 tablet (75 mcg total) by mouth daily., Disp: 90 tablet, Rfl: 0   rizatriptan (MAXALT-MLT) 5 MG disintegrating tablet, SMARTSIG:1 Tablet(s) By Mouth 1-2 Times Daily, Disp: , Rfl:    sertraline (ZOLOFT) 100 MG tablet, TAKE 1.5 TABLETS (150MG TOTAL) BY MOUTH DAILY, Disp: 135 tablet, Rfl: 2     ROS:  Review of Systems BREAST: No symptoms   Objective: There were no vitals taken for this  visit.   OBGyn Exam  Results: No results found for this or any previous visit (from the past 24 hour(s)).  Assessment/Plan: No diagnosis found.  No orders of the defined types were placed in this encounter.            GYN counsel {counseling: 16159}     F/U  No follow-ups on file.  Abuk Selleck B. Sofhia Ulibarri, PA-C 03/08/2022 8:58 PM

## 2022-03-09 ENCOUNTER — Other Ambulatory Visit (HOSPITAL_COMMUNITY)
Admission: RE | Admit: 2022-03-09 | Discharge: 2022-03-09 | Disposition: A | Discharge status: Home / Self Care | Payer: BC Managed Care – PPO | Source: Ambulatory Visit | Attending: Obstetrics and Gynecology | Admitting: Obstetrics and Gynecology

## 2022-03-09 ENCOUNTER — Ambulatory Visit (INDEPENDENT_AMBULATORY_CARE_PROVIDER_SITE_OTHER): Payer: BC Managed Care – PPO | Admitting: Obstetrics and Gynecology

## 2022-03-09 ENCOUNTER — Encounter: Payer: Self-pay | Admitting: Obstetrics and Gynecology

## 2022-03-09 VITALS — BP 90/60 | Ht 59.0 in | Wt 148.0 lb

## 2022-03-09 DIAGNOSIS — Z30431 Encounter for routine checking of intrauterine contraceptive device: Secondary | ICD-10-CM

## 2022-03-09 DIAGNOSIS — Z1151 Encounter for screening for human papillomavirus (HPV): Secondary | ICD-10-CM | POA: Diagnosis not present

## 2022-03-09 DIAGNOSIS — Z1589 Genetic susceptibility to other disease: Secondary | ICD-10-CM

## 2022-03-09 DIAGNOSIS — E039 Hypothyroidism, unspecified: Secondary | ICD-10-CM

## 2022-03-09 DIAGNOSIS — F411 Generalized anxiety disorder: Secondary | ICD-10-CM

## 2022-03-09 DIAGNOSIS — Z01419 Encounter for gynecological examination (general) (routine) without abnormal findings: Secondary | ICD-10-CM

## 2022-03-09 DIAGNOSIS — Z9189 Other specified personal risk factors, not elsewhere classified: Secondary | ICD-10-CM

## 2022-03-09 DIAGNOSIS — Z1231 Encounter for screening mammogram for malignant neoplasm of breast: Secondary | ICD-10-CM

## 2022-03-09 DIAGNOSIS — Z124 Encounter for screening for malignant neoplasm of cervix: Secondary | ICD-10-CM | POA: Diagnosis not present

## 2022-03-09 DIAGNOSIS — Z803 Family history of malignant neoplasm of breast: Secondary | ICD-10-CM

## 2022-03-09 DIAGNOSIS — Z1501 Genetic susceptibility to malignant neoplasm of breast: Secondary | ICD-10-CM

## 2022-03-09 MED ORDER — SERTRALINE HCL 100 MG PO TABS: ORAL_TABLET | ORAL | 3 refills | Status: DC

## 2022-03-09 NOTE — Patient Instructions (Signed)
I value your feedback and you entrusting us with your care. If you get a [facility] patient survey, I would appreciate you taking the time to let us know about your experience today. Thank you!  [facility]: [phone]      

## 2022-03-10 ENCOUNTER — Encounter: Payer: Self-pay | Admitting: Obstetrics and Gynecology

## 2022-03-10 LAB — T4, FREE: Free T4: 1.43 ng/dL (ref 0.82–1.77)

## 2022-03-10 LAB — TSH: TSH: 0.746 u[IU]/mL (ref 0.450–4.500)

## 2022-03-12 ENCOUNTER — Other Ambulatory Visit: Payer: Self-pay | Admitting: Obstetrics and Gynecology

## 2022-03-12 MED ORDER — LEVOTHYROXINE SODIUM 75 MCG PO TABS: 75.0000 ug | ORAL_TABLET | Freq: Every day | ORAL | 3 refills | Status: DC

## 2022-03-14 LAB — CYTOLOGY - PAP
Comment: NEGATIVE
Diagnosis: NEGATIVE
Diagnosis: REACTIVE
High risk HPV: NEGATIVE

## 2022-03-27 DIAGNOSIS — D270 Benign neoplasm of right ovary: Secondary | ICD-10-CM | POA: Diagnosis not present

## 2022-03-27 DIAGNOSIS — R1031 Right lower quadrant pain: Secondary | ICD-10-CM | POA: Diagnosis not present

## 2022-03-27 DIAGNOSIS — R932 Abnormal findings on diagnostic imaging of liver and biliary tract: Secondary | ICD-10-CM | POA: Diagnosis not present

## 2022-03-27 DIAGNOSIS — Z6830 Body mass index (BMI) 30.0-30.9, adult: Secondary | ICD-10-CM | POA: Diagnosis not present

## 2022-03-28 ENCOUNTER — Encounter: Payer: Self-pay | Admitting: Obstetrics and Gynecology

## 2022-04-06 ENCOUNTER — Telehealth: Payer: Self-pay | Admitting: Obstetrics and Gynecology

## 2022-04-06 DIAGNOSIS — Z803 Family history of malignant neoplasm of breast: Secondary | ICD-10-CM

## 2022-04-06 DIAGNOSIS — K769 Liver disease, unspecified: Secondary | ICD-10-CM

## 2022-04-06 DIAGNOSIS — Z9189 Other specified personal risk factors, not elsewhere classified: Secondary | ICD-10-CM

## 2022-04-06 NOTE — Telephone Encounter (Signed)
Spoke to pt. Went to ED at [facility] 10/23 for RLQ pain. Noted to have RTO cyst/dermoid. Pt has f/u with Dr. Marcelline Mates next wk; also to discuss BS due to BRIP1 mutation.  CT abd showed RT lobe liver lesion, MRI f/u recommended. Pt would like this scheduled, order placed.   Pt also with increased risk of breast cancer due to [place]. Mammo scheduled for 04/12/22. Pt wants to go ahead and do breast MRI since deductible met/close to being met. Order placed for 12/23, will f/u with results.

## 2022-04-10 NOTE — Progress Notes (Signed)
GYNECOLOGY PROGRESS NOTE  Subjective:    Patient ID: Tara Osborn, female    DOB: 1985-12-11, 36 y.o.   MRN: [id number]  HPI  Patient is a 36 y.o. G42P2002 female who presents for hospital follow up and surgery consult for ovarian cyst. She presented at [facility] health care ED on 03/27/2022, after awaking with non-radiating RLQ abdominal pain, which had been progressively worsening throughout the day.  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  Pain still remained for approximately  2 weeks and then spontaneously resolved.   [name] also desires to discuss surgical intervention with bilateral salpingectomy. She reports being positive for cancer gene that makes her more susceptible to ovarian cancer, would like to discuss removal of her tubes not only for contraception (as she notes she is done with childbearing), but also for prophylactic removal for cancer prevention.  Notes that she has done some research and discussed with her PCP, Tara Din, PA, and thinks this is the right decision for her. Currently has an IUD in place for contraception, is due for removal next month.    The following portions of the patient's history were reviewed and updated as appropriate:   She  has a past medical history of Anxiety, Family history of breast cancer, Genetic testing of female (11/2016), High risk of ovarian cancer, Hypothyroidism, Increased risk of breast cancer (92/0100), and Monoallelic mutation of BRIP1 gene (12/2016).  She  has a past surgical history that includes Other surgical history.  Her family history includes Breast cancer (age of onset: 36) in her sister; Colon cancer (age of onset: 68) in her niece; Hypertension in her father and mother; Hypothyroidism in her father, mother, and sister; Other in her maternal grandfather and paternal grandfather; Stroke in her mother. She has a current medication list which includes the following  prescription(s): levonorgestrel, levothyroxine, and sertraline.   Current Outpatient Medications on File Prior to Visit  Medication Sig Dispense Refill   levonorgestrel (MIRENA) 20 MCG/24HR IUD 1 each by Intrauterine route once.     levothyroxine (SYNTHROID) 75 MCG tablet Take 1 tablet (75 mcg total) by mouth daily. 90 tablet 3   sertraline (ZOLOFT) 100 MG tablet TAKE 1.5 TABLETS (150MG TOTAL) BY MOUTH DAILY 135 tablet 3   No current facility-administered medications on file prior to visit.   She has No Known Allergies..  Review of Systems Pertinent items noted in HPI and remainder of comprehensive ROS otherwise negative.   Objective:   Blood pressure 114/78, pulse 81, resp. rate 16, height 4' 11"  (1.499 m), weight 147 lb 8 oz (66.9 kg).  Body mass index is 29.79 kg/m. General appearance: alert, cooperative, and no distress Remainder of exam deferred.    Imaging (03/27/2022):  CT Abdomen and Pelvis -  Impression  No evidence of acute appendicitis.   2.4 cm predominantly low-attenuation lesion in the right lobe of the liver which likely represents a hemangioma; however, further characterization with nonemergent/outpatient MRI is recommended.   2 cm right ovarian dermoid cyst.   Right corpus luteum cyst.    FOLLOW-UP RECOMMENDATION:   Item for Follow Up:  1. Acuity: Non-urgent  2. Modality: MR  3. Anatomy: Abdomen  4. TimeFrame: 3 months   Ultrasound Abdominal and Transvaginal  Impression   No sonographic evidence of ovarian torsion.   Redemonstrated right corpus luteum cyst and right dermoid cyst.   Spectral doppler was performed on  bilateral ovaries.  Additional transabdominal images were not obtained.  3D reconstruction images were not obtained.     Assessment:   1. Colicky RLQ abdominal pain   2. Monoallelic mutation of BRIP1 gene   3. Unwanted fertility   4. IUD (intrauterine device) in place   5. Need for immunization against influenza     Plan:    1. Colicky RLQ abdominal pain - Pain has resolved. Unclear cause. Patient with small hemangioma of liver and small dermoid cyst on right ovary. Discussed that these were not likely cause of her pain due to size. However if surgical intervention desired, could potentially remove small dermoid.   2. Monoallelic mutation of BRIP1 gene - Increased risk of ovarian cancer due to gene mutation. Patient also with family history of breast cancer. Desires removal of fallopian tubes at this time with tubal ligation.  Not yet desiring removal of ovaries as she is far from menopausal status.   3. Unwanted fertility - Patient currently with IUD in place, due for removal next month. Desires permanent sterilization. Discussed all contraceptive options, declines . Desires tubal removal with sterilization. Discussed risks/benefits of surgical procedure.  Will plan for surgical intervention in next several weeks (Desires 05/08/2022)  4.  IUD (intrauterine device) in place - Due for removal in 1 month. Discussed option of replacing during time of surgical intervention to continue management of menstrual cycles. Patient notes she would like to do this.   5. Need for immunization against influenza - Flu Vaccine QUAD 16moIM (Fluarix, Fluzone & Alfiuria Quad PF)   A total of 46 minutes were spent during this encounter, including review of previous progress notes, recent imaging and labs, face-to-face with time with patient involving counseling and coordination of care, as well as documentation for current visit.   ARubie Maid MD [facility]

## 2022-04-11 ENCOUNTER — Encounter: Payer: Self-pay | Admitting: Obstetrics and Gynecology

## 2022-04-11 ENCOUNTER — Ambulatory Visit: Payer: BC Managed Care – PPO | Admitting: Obstetrics and Gynecology

## 2022-04-11 VITALS — BP 114/78 | HR 81 | Resp 16 | Ht 59.0 in | Wt 147.5 lb

## 2022-04-11 DIAGNOSIS — Z1501 Genetic susceptibility to malignant neoplasm of breast: Secondary | ICD-10-CM | POA: Diagnosis not present

## 2022-04-11 DIAGNOSIS — Z23 Encounter for immunization: Secondary | ICD-10-CM

## 2022-04-11 DIAGNOSIS — Z01812 Encounter for preprocedural laboratory examination: Secondary | ICD-10-CM | POA: Diagnosis not present

## 2022-04-11 DIAGNOSIS — Z3009 Encounter for other general counseling and advice on contraception: Secondary | ICD-10-CM | POA: Diagnosis not present

## 2022-04-11 DIAGNOSIS — R1031 Right lower quadrant pain: Secondary | ICD-10-CM | POA: Diagnosis not present

## 2022-04-11 DIAGNOSIS — Z1589 Genetic susceptibility to other disease: Secondary | ICD-10-CM

## 2022-04-11 DIAGNOSIS — Z975 Presence of (intrauterine) contraceptive device: Secondary | ICD-10-CM | POA: Diagnosis not present

## 2022-04-12 ENCOUNTER — Ambulatory Visit
Admission: RE | Admit: 2022-04-12 | Discharge: 2022-04-12 | Disposition: A | Discharge status: Home / Self Care | Payer: BC Managed Care – PPO | Source: Ambulatory Visit | Attending: Obstetrics and Gynecology | Admitting: Obstetrics and Gynecology

## 2022-04-12 DIAGNOSIS — Z1501 Genetic susceptibility to malignant neoplasm of breast: Secondary | ICD-10-CM | POA: Insufficient documentation

## 2022-04-12 DIAGNOSIS — Z1231 Encounter for screening mammogram for malignant neoplasm of breast: Secondary | ICD-10-CM | POA: Insufficient documentation

## 2022-04-12 DIAGNOSIS — Z9189 Other specified personal risk factors, not elsewhere classified: Secondary | ICD-10-CM | POA: Diagnosis not present

## 2022-04-12 DIAGNOSIS — Z803 Family history of malignant neoplasm of breast: Secondary | ICD-10-CM | POA: Diagnosis not present

## 2022-04-12 DIAGNOSIS — Z1589 Genetic susceptibility to other disease: Secondary | ICD-10-CM | POA: Insufficient documentation

## 2022-04-12 NOTE — H&P (Signed)
[facility] [address] [place],   [address] Phone:  [phone]   Fax:  [phone]    GYNECOLOGY PREOPERATIVE HISTORY AND PHYSICAL   Subjective:  Tara Osborn is a 36 y.o. [id number] here for surgical management of undesired fertility and higher cancer risk. Also has a dermoid cyst on her right ovary.  Currently has a Mirena IUD in place, due for removal next month.   She presented at [facility] ED on 03/27/2022, after awaking with non-radiating RLQ abdominal pain, which had been progressively worsening throughout the day.  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  Pain still remained for approximately  2 weeks and then spontaneously resolved.   Additionally, she desires surgical intervention with bilateral salpingectomy. She reports being positive for cancer gene that makes her more susceptible to breast and ovarian cancer (Monoallelic mutation of BRIP1 gene ), would like to have removal of her tubes not only for contraception but also for prophylactic removal for cancer prevention.    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   Myriad MyRisk; increased ovar cancer risk    Past Surgical History:  Procedure Laterality Date   OTHER SURGICAL HISTORY     Surgery in foot    OB History  Gravida Para Term Preterm AB Living  2 2 2     2   SAB IAB Ectopic Multiple Live Births           2    # Outcome Date GA Lbr Len/2nd Weight Sex Delivery Anes PTL Lv  2 Term 03/27/14 [redacted]w[redacted]d  F Vag-Spont   LIV  1 Term 03/09/12    M Vag-Spont  N LIV    Family History  Problem Relation Age of Onset   Breast cancer Sister 261      triple negative; currently 361  Hypothyroidism Sister    Hypertension Mother    Hypothyroidism Mother    Stroke Mother    Hypertension Father    Hypothyroidism Father    Other Maternal Grandfather        myocardial infarction   Other Paternal Grandfather        myocardial infarction   Colon cancer Niece 217  Social History   Socioeconomic History   Marital status: Married    Spouse name: Not on file   Number of children: Not on file   Years of education: Not on file   Highest education level: Not on file  Occupational History   Not on file  Tobacco Use   Smoking status: Never   Smokeless tobacco: Never  Vaping Use   Vaping Use: Never used  Substance and Sexual Activity   Alcohol use: No   Drug use: No   Sexual activity: Yes    Birth control/protection: I.U.D.    Comment: Mirena  Other Topics Concern   Not on file  Social History Narrative   Not on file   Social Determinants of Health   Financial Resource Strain: Not on file  Food Insecurity: Not on file  Transportation Needs: Not on file  Physical Activity: Insufficiently Active (09/06/2017)   Exercise Vital Sign    Days of Exercise per Week: 5 days    Minutes of Exercise per Session: 20 min  Stress: Stress Concern Present (09/06/2017)   [place]    Feeling of Stress : To some extent  Social Connections: Somewhat Isolated (09/06/2017)   Social Connection and Isolation Panel [NHANES]    Frequency of Communication with Friends and Family: More than three times a week    Frequency of Social Gatherings with Friends and Family: Once a week    Attends Religious Services: Never    Marine scientist or  Organizations: No    Attends Archivist Meetings: Never    Marital Status: Married  Human resources officer Violence: Not At Risk (09/06/2017)   Humiliation, Afraid, Rape, and Kick questionnaire    Fear of Current or Ex-Partner: No    Emotionally Abused: No    Physically Abused: No    Sexually Abused: No   Current Outpatient Medications on File Prior to Visit  Medication Sig Dispense Refill   levonorgestrel (MIRENA) 20 MCG/24HR IUD 1 each by Intrauterine route once.     levothyroxine (SYNTHROID) 75 MCG tablet Take 1 tablet (75 mcg total) by mouth daily. 90 tablet 3   sertraline (ZOLOFT) 100 MG tablet TAKE 1.5 TABLETS (150MG TOTAL) BY MOUTH DAILY 135 tablet 3   No current facility-administered medications on file prior to visit.   No Known Allergies   Review of Systems Constitutional: No recent fever/chills/sweats Respiratory: No recent cough/bronchitis Cardiovascular: No chest pain Gastrointestinal: No recent nausea/vomiting/diarrhea Genitourinary: No UTI symptoms Hematologic/lymphatic:No history of coagulopathy or recent blood thinner use    Objective:   Blood pressure 114/78, pulse 81, resp. rate 16, height 4' 11"  (1.499 m), weight 147 lb 8 oz (66.9 kg). CONSTITUTIONAL: Well-developed, well-nourished female in no acute distress.  HENT:  Normocephalic, atraumatic, External right and left ear normal. Oropharynx is clear and moist EYES: Conjunctivae and EOM are normal. Pupils are equal, round, and reactive to light. No scleral icterus.  NECK: Normal range of motion, supple, no masses SKIN: Skin is warm and dry. No rash noted. Not diaphoretic. No erythema. No pallor. NEUROLOGIC: Alert and oriented to person, place, and time. Normal reflexes, muscle tone coordination. No cranial nerve deficit noted. PSYCHIATRIC: Normal mood and affect. Normal behavior. Normal judgment and thought content. CARDIOVASCULAR: Normal heart rate noted, regular rhythm RESPIRATORY: Effort and breath  sounds normal, no problems with respiration noted ABDOMEN: Soft, nontender, nondistended. PELVIC: Deferred MUSCULOSKELETAL: Normal range of motion. No edema and no tenderness. 2+ distal pulses.    Labs: No results found for this or any previous visit (from the past 336 hour(s)).   Imaging Studies: No results found.  Assessment:  Unwanted fertility IUD (intrauterine device) in place Monoallelic mutation of BRIP1 gene  Plan:   1) Counseling:  - Procedure, risks, reasons, benefits and complications (including injury to bowel, bladder, major blood vessel, ureter, bleeding, possibility of transfusion, infection, or fistula formation) reviewed in detail. Likelihood of success in alleviating the patient's condition was discussed. Routine postoperative instructions will be reviewed with the patient and her family in detail after surgery.   The patient concurred with the proposed plan, giving informed  written consent for the surgery. - Patient desires permanent sterilization.  Other reversible forms of contraception were discussed with patient; she declines all other modalities. Risks of procedure discussed with patient including but not limited to: risk of regret, permanence of method, bleeding, infection, injury to surrounding organs and need for additional procedures.  Failure risk of about 1% with increased risk of ectopic gestation if pregnancy occurs was also discussed with patient.  Also discussed possibility of post-tubal pain syndrome. Patient verbalized understanding of these risks and wants to proceed with sterilization.    2) Preop testing ordered.  3) Instructions reviewed, including NPO after midnight.   Rubie Maid, MD [facility] at [facility]

## 2022-04-12 NOTE — H&P (View-Only) (Signed)
[facility] [address] [place], [place]  [address] Phone:  [phone]   Fax:  [phone]    GYNECOLOGY PREOPERATIVE HISTORY AND PHYSICAL   Subjective:  Tara Osborn is a 36 y.o. L4T6256 here for surgical management of undesired fertility and higher cancer risk. Also has a dermoid cyst on her right ovary.  Currently has a Mirena IUD in place, due for removal next month.   She presented at [facility] ED on 03/27/2022, after awaking with non-radiating RLQ abdominal pain, which had been progressively worsening throughout the day.  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  Pain still remained for approximately  2 weeks and then spontaneously resolved.   Additionally, she desires surgical intervention with bilateral salpingectomy. She reports being positive for cancer gene that makes her more susceptible to breast and ovarian cancer (Monoallelic mutation of BRIP1 gene ), would like to have removal of her tubes not only for contraception but also for prophylactic removal for cancer prevention.    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   Myriad MyRisk; increased ovar cancer risk    Past Surgical History:  Procedure Laterality Date   OTHER SURGICAL HISTORY     Surgery in foot    OB History  Gravida Para Term Preterm AB Living  2 2 2     2   SAB IAB Ectopic Multiple Live Births           2    # Outcome Date GA Lbr Len/2nd Weight Sex Delivery Anes PTL Lv  2 Term 03/27/14 [redacted]w[redacted]d  F Vag-Spont   LIV  1 Term 03/09/12    M Vag-Spont  N LIV    Family History  Problem Relation Age of Onset   Breast cancer Sister 272      triple negative; currently 358  Hypothyroidism Sister    Hypertension Mother    Hypothyroidism Mother    Stroke Mother    Hypertension Father    Hypothyroidism Father    Other Maternal Grandfather        myocardial infarction   Other Paternal Grandfather        myocardial infarction   Colon cancer Niece 221  Social History   Socioeconomic History   Marital status: Married    Spouse name: Not on file   Number of children: Not on file   Years of education: Not on file   Highest education level: Not on file  Occupational History   Not on file  Tobacco Use   Smoking status: Never   Smokeless tobacco: Never  Vaping Use   Vaping Use: Never used  Substance and Sexual Activity   Alcohol use: No   Drug use: No   Sexual activity: Yes    Birth control/protection: I.U.D.    Comment: Mirena  Other Topics Concern   Not on file  Social History Narrative   Not on file   Social Determinants of Health   Financial Resource Strain: Not on file  Food Insecurity: Not on file  Transportation Needs: Not on file  Physical Activity: Insufficiently Active (09/06/2017)   Exercise Vital Sign    Days of Exercise per Week: 5 days    Minutes of Exercise per Session: 20 min  Stress: Stress Concern Present (09/06/2017)   [place]    Feeling of Stress : To some extent  Social Connections: Somewhat Isolated (09/06/2017)   Social Connection and Isolation Panel [NHANES]    Frequency of Communication with Friends and Family: More than three times a week    Frequency of Social Gatherings with Friends and Family: Once a week    Attends Religious Services: Never    Marine scientist or  Organizations: No    Attends Archivist Meetings: Never    Marital Status: Married  Human resources officer Violence: Not At Risk (09/06/2017)   Humiliation, Afraid, Rape, and Kick questionnaire    Fear of Current or Ex-Partner: No    Emotionally Abused: No    Physically Abused: No    Sexually Abused: No   Current Outpatient Medications on File Prior to Visit  Medication Sig Dispense Refill   levonorgestrel (MIRENA) 20 MCG/24HR IUD 1 each by Intrauterine route once.     levothyroxine (SYNTHROID) 75 MCG tablet Take 1 tablet (75 mcg total) by mouth daily. 90 tablet 3   sertraline (ZOLOFT) 100 MG tablet TAKE 1.5 TABLETS (150MG TOTAL) BY MOUTH DAILY 135 tablet 3   No current facility-administered medications on file prior to visit.   No Known Allergies   Review of Systems Constitutional: No recent fever/chills/sweats Respiratory: No recent cough/bronchitis Cardiovascular: No chest pain Gastrointestinal: No recent nausea/vomiting/diarrhea Genitourinary: No UTI symptoms Hematologic/lymphatic:No history of coagulopathy or recent blood thinner use    Objective:   Blood pressure 114/78, pulse 81, resp. rate 16, height 4' 11"  (1.499 m), weight 147 lb 8 oz (66.9 kg). CONSTITUTIONAL: Well-developed, well-nourished female in no acute distress.  HENT:  Normocephalic, atraumatic, External right and left ear normal. Oropharynx is clear and moist EYES: Conjunctivae and EOM are normal. Pupils are equal, round, and reactive to light. No scleral icterus.  NECK: Normal range of motion, supple, no masses SKIN: Skin is warm and dry. No rash noted. Not diaphoretic. No erythema. No pallor. NEUROLOGIC: Alert and oriented to person, place, and time. Normal reflexes, muscle tone coordination. No cranial nerve deficit noted. PSYCHIATRIC: Normal mood and affect. Normal behavior. Normal judgment and thought content. CARDIOVASCULAR: Normal heart rate noted, regular rhythm RESPIRATORY: Effort and breath  sounds normal, no problems with respiration noted ABDOMEN: Soft, nontender, nondistended. PELVIC: Deferred MUSCULOSKELETAL: Normal range of motion. No edema and no tenderness. 2+ distal pulses.    Labs: No results found for this or any previous visit (from the past 336 hour(s)).   Imaging Studies: No results found.  Assessment:  Unwanted fertility IUD (intrauterine device) in place Monoallelic mutation of BRIP1 gene  Plan:   1) Counseling:  - Procedure, risks, reasons, benefits and complications (including injury to bowel, bladder, major blood vessel, ureter, bleeding, possibility of transfusion, infection, or fistula formation) reviewed in detail. Likelihood of success in alleviating the patient's condition was discussed. Routine postoperative instructions will be reviewed with the patient and her family in detail after surgery.   The patient concurred with the proposed plan, giving informed  written consent for the surgery. - Patient desires permanent sterilization.  Other reversible forms of contraception were discussed with patient; she declines all other modalities. Risks of procedure discussed with patient including but not limited to: risk of regret, permanence of method, bleeding, infection, injury to surrounding organs and need for additional procedures.  Failure risk of about 1% with increased risk of ectopic gestation if pregnancy occurs was also discussed with patient.  Also discussed possibility of post-tubal pain syndrome. Patient verbalized understanding of these risks and wants to proceed with sterilization.    2) Preop testing ordered.  3) Instructions reviewed, including NPO after midnight.   Rubie Maid, MD [facility] at [facility]

## 2022-04-14 ENCOUNTER — Other Ambulatory Visit: Payer: Self-pay | Admitting: Obstetrics and Gynecology

## 2022-04-14 DIAGNOSIS — N63 Unspecified lump in unspecified breast: Secondary | ICD-10-CM

## 2022-04-14 DIAGNOSIS — R928 Other abnormal and inconclusive findings on diagnostic imaging of breast: Secondary | ICD-10-CM

## 2022-04-18 NOTE — Addendum Note (Signed)
Addended by: Ardeth Perfect B on: 62/83/6629 01:29 PM   Modules accepted: Orders

## 2022-04-26 ENCOUNTER — Ambulatory Visit
Admission: RE | Admit: 2022-04-26 | Discharge: 2022-04-26 | Disposition: A | Discharge status: Home / Self Care | Payer: BC Managed Care – PPO | Source: Ambulatory Visit | Attending: Obstetrics and Gynecology | Admitting: Obstetrics and Gynecology

## 2022-04-26 DIAGNOSIS — K769 Liver disease, unspecified: Secondary | ICD-10-CM | POA: Insufficient documentation

## 2022-04-26 DIAGNOSIS — D1800 Hemangioma unspecified site: Secondary | ICD-10-CM | POA: Diagnosis not present

## 2022-04-26 MED ORDER — GADOBUTROL 1 MMOL/ML IV SOLN
6.6000 mL | Freq: Once | INTRAVENOUS | Status: AC | PRN
  Administered 2022-04-26: 6.6 mL via INTRAVENOUS

## 2022-04-27 ENCOUNTER — Other Ambulatory Visit: Payer: Self-pay | Admitting: Obstetrics and Gynecology

## 2022-04-27 ENCOUNTER — Ambulatory Visit
Admission: RE | Admit: 2022-04-27 | Discharge: 2022-04-27 | Disposition: A | Discharge status: Home / Self Care | Payer: BC Managed Care – PPO | Source: Ambulatory Visit | Attending: Obstetrics and Gynecology | Admitting: Obstetrics and Gynecology

## 2022-04-27 DIAGNOSIS — R928 Other abnormal and inconclusive findings on diagnostic imaging of breast: Secondary | ICD-10-CM | POA: Diagnosis not present

## 2022-04-27 DIAGNOSIS — N63 Unspecified lump in unspecified breast: Secondary | ICD-10-CM

## 2022-04-27 DIAGNOSIS — R922 Inconclusive mammogram: Secondary | ICD-10-CM | POA: Diagnosis not present

## 2022-04-27 DIAGNOSIS — Z803 Family history of malignant neoplasm of breast: Secondary | ICD-10-CM | POA: Diagnosis not present

## 2022-04-28 ENCOUNTER — Other Ambulatory Visit: Payer: Self-pay

## 2022-04-28 ENCOUNTER — Other Ambulatory Visit: Payer: Self-pay | Admitting: Obstetrics and Gynecology

## 2022-04-28 ENCOUNTER — Encounter
Admission: RE | Admit: 2022-04-28 | Discharge: 2022-04-28 | Disposition: A | Discharge status: Home / Self Care | Payer: BC Managed Care – PPO | Source: Ambulatory Visit | Attending: Obstetrics and Gynecology | Admitting: Obstetrics and Gynecology

## 2022-04-28 DIAGNOSIS — Z01812 Encounter for preprocedural laboratory examination: Secondary | ICD-10-CM

## 2022-04-28 DIAGNOSIS — N63 Unspecified lump in unspecified breast: Secondary | ICD-10-CM

## 2022-04-28 DIAGNOSIS — R928 Other abnormal and inconclusive findings on diagnostic imaging of breast: Secondary | ICD-10-CM

## 2022-04-28 HISTORY — DX: Headache, unspecified: R51.9

## 2022-04-28 NOTE — Patient Instructions (Addendum)
Your procedure is scheduled on: 05/08/22 - Monday Report to the Registration Desk on the 1st floor of the [facility]. To find out your arrival time, please call [phone] between 1PM - 3PM on: 05/05/22 - Friday If your arrival time is 6:00 am, do not arrive prior to that time as the [facility] entrance doors do not open until 6:00 am.  REMEMBER: Instructions that are not followed completely may result in serious medical risk, up to and including death; or upon the discretion of your surgeon and anesthesiologist your surgery may need to be rescheduled.  Do not eat food or drink any fluids after midnight the night before surgery.  No gum chewing, lozengers or hard candies.  TAKE THESE MEDICATIONS THE MORNING OF SURGERY WITH A SIP OF WATER:  - levothyroxine (SYNTHROID)  - sertraline (ZOLOFT)   One week prior to surgery: Stop Anti-inflammatories (NSAIDS) such as Advil, Aleve, Ibuprofen, Motrin, Naproxen, Naprosyn and Aspirin based products such as Excedrin, Goodys Powder, BC Powder.  Stop ANY OVER THE COUNTER supplements until after surgery.  You may however, continue to take Tylenol if needed for pain up until the day of surgery.  No Alcohol for 24 hours before or after surgery.  No Smoking including e-cigarettes for 24 hours prior to surgery.  No chewable tobacco products for at least 6 hours prior to surgery.  No nicotine patches on the day of surgery.  Do not use any "recreational" drugs for at least a week prior to your surgery.  Please be advised that the combination of cocaine and anesthesia may have negative outcomes, up to and including death. If you test positive for cocaine, your surgery will be cancelled.  On the morning of surgery brush your teeth with toothpaste and water, you may rinse your mouth with mouthwash if you wish. Do not swallow any toothpaste or mouthwash.  Use CHG Soap or wipes as directed on instruction sheet.  Do not wear jewelry, make-up,  hairpins, clips or nail polish.  Do not wear lotions, powders, or perfumes.   Do not shave body from the neck down 48 hours prior to surgery just in case you cut yourself which could leave a site for infection.  Also, freshly shaved skin may become irritated if using the CHG soap.  Contact lenses, hearing aids and dentures may not be worn into surgery.  Do not bring valuables to the hospital. [facility] is not responsible for any missing/lost belongings or valuables.   Notify your doctor if there is any change in your medical condition (cold, fever, infection).  Wear comfortable clothing (specific to your surgery type) to the hospital.  After surgery, you can help prevent lung complications by doing breathing exercises.  Take deep breaths and cough every 1-2 hours. Your doctor may order a device called an Incentive Spirometer to help you take deep breaths. When coughing or sneezing, hold a pillow firmly against your incision with both hands. This is called "splinting." Doing this helps protect your incision. It also decreases belly discomfort.  If you are being admitted to the hospital overnight, leave your suitcase in the car. After surgery it may be brought to your room.  If you are being discharged the day of surgery, you will not be allowed to drive home. You will need a responsible adult (18 years or older) to drive you home and stay with you that night.   If you are taking public transportation, you will need to have a responsible adult (  18 years or older) with you. Please confirm with your physician that it is acceptable to use public transportation.   Please call the [facility] at [phone] if you have any questions about these instructions.  Surgery Visitation Policy:  Patients undergoing a surgery or procedure may have two family members or support persons with them as long as the person is not COVID-19 positive or experiencing its symptoms.    Inpatient Visitation:    Visiting hours are 7 a.m. to 8 p.m. Up to four visitors are allowed at one time in a patient room, including children. The visitors may rotate out with other people during the day. One designated support person (adult) may remain overnight.

## 2022-05-08 ENCOUNTER — Other Ambulatory Visit: Payer: Self-pay

## 2022-05-08 ENCOUNTER — Ambulatory Visit: Payer: BC Managed Care – PPO | Admitting: Certified Registered"

## 2022-05-08 ENCOUNTER — Encounter
Admission: RE | Disposition: A | Discharge status: Home / Self Care | Payer: Self-pay | Source: Home / Self Care | Attending: Obstetrics and Gynecology

## 2022-05-08 ENCOUNTER — Encounter: Payer: Self-pay | Admitting: Obstetrics and Gynecology

## 2022-05-08 ENCOUNTER — Ambulatory Visit
Admission: RE | Admit: 2022-05-08 | Discharge: 2022-05-08 | Disposition: A | Discharge status: Home / Self Care | Payer: BC Managed Care – PPO | Attending: Obstetrics and Gynecology | Admitting: Obstetrics and Gynecology

## 2022-05-08 DIAGNOSIS — E039 Hypothyroidism, unspecified: Secondary | ICD-10-CM | POA: Diagnosis not present

## 2022-05-08 DIAGNOSIS — C561 Malignant neoplasm of right ovary: Secondary | ICD-10-CM | POA: Diagnosis not present

## 2022-05-08 DIAGNOSIS — Z9189 Other specified personal risk factors, not elsewhere classified: Secondary | ICD-10-CM | POA: Diagnosis not present

## 2022-05-08 DIAGNOSIS — Z975 Presence of (intrauterine) contraceptive device: Secondary | ICD-10-CM

## 2022-05-08 DIAGNOSIS — F411 Generalized anxiety disorder: Secondary | ICD-10-CM

## 2022-05-08 DIAGNOSIS — Z30433 Encounter for removal and reinsertion of intrauterine contraceptive device: Secondary | ICD-10-CM

## 2022-05-08 DIAGNOSIS — Z302 Encounter for sterilization: Secondary | ICD-10-CM | POA: Insufficient documentation

## 2022-05-08 DIAGNOSIS — Z30432 Encounter for removal of intrauterine contraceptive device: Secondary | ICD-10-CM | POA: Insufficient documentation

## 2022-05-08 DIAGNOSIS — Z1501 Genetic susceptibility to malignant neoplasm of breast: Secondary | ICD-10-CM | POA: Insufficient documentation

## 2022-05-08 DIAGNOSIS — D270 Benign neoplasm of right ovary: Secondary | ICD-10-CM | POA: Diagnosis not present

## 2022-05-08 DIAGNOSIS — Z1502 Genetic susceptibility to malignant neoplasm of ovary: Secondary | ICD-10-CM | POA: Diagnosis not present

## 2022-05-08 DIAGNOSIS — Z01812 Encounter for preprocedural laboratory examination: Secondary | ICD-10-CM

## 2022-05-08 DIAGNOSIS — N83201 Unspecified ovarian cyst, right side: Secondary | ICD-10-CM

## 2022-05-08 DIAGNOSIS — Z1589 Genetic susceptibility to other disease: Secondary | ICD-10-CM

## 2022-05-08 DIAGNOSIS — Z3009 Encounter for other general counseling and advice on contraception: Secondary | ICD-10-CM

## 2022-05-08 HISTORY — PX: INTRAUTERINE DEVICE (IUD) INSERTION: SHX5877

## 2022-05-08 HISTORY — PX: LAPAROSCOPIC BILATERAL SALPINGECTOMY: SHX5889

## 2022-05-08 HISTORY — PX: LAPAROSCOPIC OVARIAN CYSTECTOMY: SHX6248

## 2022-05-08 HISTORY — PX: IUD REMOVAL: SHX5392

## 2022-05-08 LAB — CBC
HCT: 43.4 % (ref 36.0–46.0)
Hemoglobin: 14.9 g/dL (ref 12.0–15.0)
MCH: 30 pg (ref 26.0–34.0)
MCHC: 34.3 g/dL (ref 30.0–36.0)
MCV: 87.5 fL (ref 80.0–100.0)
Platelets: 277 K/uL (ref 150–400)
RBC: 4.96 MIL/uL (ref 3.87–5.11)
RDW: 12.3 % (ref 11.5–15.5)
WBC: 7 K/uL (ref 4.0–10.5)
nRBC: 0 % (ref 0.0–0.2)

## 2022-05-08 LAB — TYPE AND SCREEN
ABO/RH(D): O POS
Antibody Screen: NEGATIVE

## 2022-05-08 LAB — POCT PREGNANCY, URINE: Preg Test, Ur: NEGATIVE

## 2022-05-08 SURGERY — REMOVAL, INTRAUTERINE DEVICE
Anesthesia: General | Site: Uterus | Laterality: Right

## 2022-05-08 MED ORDER — SIMETHICONE 80 MG PO CHEW: 80.0000 mg | CHEWABLE_TABLET | Freq: Four times a day (QID) | ORAL | 0 refills | Status: DC | PRN

## 2022-05-08 MED ORDER — MIDAZOLAM HCL 2 MG/2ML IJ SOLN
INTRAMUSCULAR | Status: DC | PRN
  Administered 2022-05-08: 2 mg via INTRAVENOUS

## 2022-05-08 MED ORDER — ACETAMINOPHEN 500 MG PO TABS
ORAL_TABLET | ORAL | Status: AC
  Filled 2022-05-08: qty 2

## 2022-05-08 MED ORDER — BUPIVACAINE HCL (PF) 0.5 % IJ SOLN
INTRAMUSCULAR | Status: AC
  Filled 2022-05-08: qty 30

## 2022-05-08 MED ORDER — IBUPROFEN 600 MG PO TABS: 600.0000 mg | ORAL_TABLET | Freq: Four times a day (QID) | ORAL | 0 refills | Status: DC | PRN

## 2022-05-08 MED ORDER — LACTATED RINGERS IV SOLN: INTRAVENOUS | Status: DC

## 2022-05-08 MED ORDER — ONDANSETRON HCL 4 MG/2ML IJ SOLN
INTRAMUSCULAR | Status: AC
  Filled 2022-05-08: qty 2

## 2022-05-08 MED ORDER — FENTANYL CITRATE (PF) 100 MCG/2ML IJ SOLN
INTRAMUSCULAR | Status: AC
  Filled 2022-05-08: qty 2

## 2022-05-08 MED ORDER — LEVONORGESTREL 20 MCG/DAY IU IUD
1.0000 | INTRAUTERINE_SYSTEM | Freq: Once | INTRAUTERINE | Status: AC
  Administered 2022-05-08: 1 via INTRAUTERINE

## 2022-05-08 MED ORDER — GLYCOPYRROLATE 0.2 MG/ML IJ SOLN
INTRAMUSCULAR | Status: DC | PRN
  Administered 2022-05-08 (×2): .2 mg via INTRAVENOUS

## 2022-05-08 MED ORDER — GLYCOPYRROLATE 0.2 MG/ML IJ SOLN
INTRAMUSCULAR | Status: AC
  Filled 2022-05-08: qty 1

## 2022-05-08 MED ORDER — FENTANYL CITRATE (PF) 100 MCG/2ML IJ SOLN
INTRAMUSCULAR | Status: DC | PRN
  Administered 2022-05-08 (×2): 50 ug via INTRAVENOUS

## 2022-05-08 MED ORDER — BUPIVACAINE HCL 0.5 % IJ SOLN
INTRAMUSCULAR | Status: DC | PRN
  Administered 2022-05-08: 15 mL

## 2022-05-08 MED ORDER — LIDOCAINE HCL (PF) 2 % IJ SOLN
INTRAMUSCULAR | Status: AC
  Filled 2022-05-08: qty 5

## 2022-05-08 MED ORDER — DEXAMETHASONE SODIUM PHOSPHATE 10 MG/ML IJ SOLN
INTRAMUSCULAR | Status: DC | PRN
  Administered 2022-05-08: 10 mg via INTRAVENOUS

## 2022-05-08 MED ORDER — MIDAZOLAM HCL 2 MG/2ML IJ SOLN
INTRAMUSCULAR | Status: AC
  Filled 2022-05-08: qty 2

## 2022-05-08 MED ORDER — FAMOTIDINE 20 MG PO TABS: 20.0000 mg | ORAL_TABLET | Freq: Once | ORAL | Status: AC

## 2022-05-08 MED ORDER — 0.9 % SODIUM CHLORIDE (POUR BTL) OPTIME
TOPICAL | Status: DC | PRN
  Administered 2022-05-08: 500 mL

## 2022-05-08 MED ORDER — DROPERIDOL 2.5 MG/ML IJ SOLN
INTRAMUSCULAR | Status: AC
  Filled 2022-05-08: qty 2

## 2022-05-08 MED ORDER — PROPOFOL 10 MG/ML IV BOLUS
INTRAVENOUS | Status: AC
  Filled 2022-05-08: qty 20

## 2022-05-08 MED ORDER — LACTATED RINGERS IV SOLN: INTRAVENOUS | Status: DC | PRN

## 2022-05-08 MED ORDER — ONDANSETRON HCL 4 MG/2ML IJ SOLN
INTRAMUSCULAR | Status: DC | PRN
  Administered 2022-05-08: 4 mg via INTRAVENOUS

## 2022-05-08 MED ORDER — CHLORHEXIDINE GLUCONATE 0.12 % MT SOLN
OROMUCOSAL | Status: AC
  Administered 2022-05-08: 15 mL via OROMUCOSAL
  Filled 2022-05-08: qty 15

## 2022-05-08 MED ORDER — DEXAMETHASONE SODIUM PHOSPHATE 10 MG/ML IJ SOLN
INTRAMUSCULAR | Status: AC
  Filled 2022-05-08: qty 1

## 2022-05-08 MED ORDER — DROPERIDOL 2.5 MG/ML IJ SOLN
0.6250 mg | Freq: Once | INTRAMUSCULAR | Status: AC
  Administered 2022-05-08: 0.625 mg via INTRAVENOUS

## 2022-05-08 MED ORDER — FAMOTIDINE 20 MG PO TABS
ORAL_TABLET | ORAL | Status: AC
  Administered 2022-05-08: 20 mg via ORAL
  Filled 2022-05-08: qty 1

## 2022-05-08 MED ORDER — ROCURONIUM BROMIDE 100 MG/10ML IV SOLN
INTRAVENOUS | Status: DC | PRN
  Administered 2022-05-08: 50 mg via INTRAVENOUS

## 2022-05-08 MED ORDER — OXYCODONE HCL 5 MG PO TABS: 5.0000 mg | ORAL_TABLET | Freq: Three times a day (TID) | ORAL | 0 refills | Status: DC | PRN

## 2022-05-08 MED ORDER — ESMOLOL HCL 100 MG/10ML IV SOLN
INTRAVENOUS | Status: AC
  Filled 2022-05-08: qty 10

## 2022-05-08 MED ORDER — FENTANYL CITRATE (PF) 100 MCG/2ML IJ SOLN
25.0000 ug | INTRAMUSCULAR | Status: DC | PRN
  Administered 2022-05-08: 50 ug via INTRAVENOUS

## 2022-05-08 MED ORDER — LIDOCAINE HCL (CARDIAC) PF 100 MG/5ML IV SOSY
PREFILLED_SYRINGE | INTRAVENOUS | Status: DC | PRN
  Administered 2022-05-08: 60 mg via INTRAVENOUS

## 2022-05-08 MED ORDER — ONDANSETRON HCL 4 MG/2ML IJ SOLN
4.0000 mg | Freq: Once | INTRAMUSCULAR | Status: AC
  Administered 2022-05-08: 4 mg via INTRAVENOUS

## 2022-05-08 MED ORDER — PROPOFOL 500 MG/50ML IV EMUL
INTRAVENOUS | Status: DC | PRN
  Administered 2022-05-08: 25 ug/kg/min via INTRAVENOUS

## 2022-05-08 MED ORDER — OXYCODONE HCL 5 MG PO TABS
ORAL_TABLET | ORAL | Status: AC
  Filled 2022-05-08: qty 1

## 2022-05-08 MED ORDER — ESMOLOL HCL 100 MG/10ML IV SOLN
INTRAVENOUS | Status: DC | PRN
  Administered 2022-05-08: 10 mg via INTRAVENOUS

## 2022-05-08 MED ORDER — ACETAMINOPHEN 500 MG PO TABS
1000.0000 mg | ORAL_TABLET | ORAL | Status: AC
  Administered 2022-05-08: 1000 mg via ORAL

## 2022-05-08 MED ORDER — OXYCODONE HCL 5 MG PO TABS
5.0000 mg | ORAL_TABLET | Freq: Once | ORAL | Status: AC | PRN
  Administered 2022-05-08: 5 mg via ORAL

## 2022-05-08 MED ORDER — ORAL CARE MOUTH RINSE: 15.0000 mL | Freq: Once | OROMUCOSAL | Status: AC

## 2022-05-08 MED ORDER — LEVONORGESTREL 20 MCG/DAY IU IUD
INTRAUTERINE_SYSTEM | INTRAUTERINE | Status: AC
  Filled 2022-05-08: qty 1

## 2022-05-08 MED ORDER — GABAPENTIN 300 MG PO CAPS: 300.0000 mg | ORAL_CAPSULE | ORAL | Status: AC

## 2022-05-08 MED ORDER — CELECOXIB 200 MG PO CAPS: 400.0000 mg | ORAL_CAPSULE | ORAL | Status: AC

## 2022-05-08 MED ORDER — CHLORHEXIDINE GLUCONATE 0.12 % MT SOLN: 15.0000 mL | Freq: Once | OROMUCOSAL | Status: AC

## 2022-05-08 MED ORDER — PROPOFOL 10 MG/ML IV BOLUS
INTRAVENOUS | Status: DC | PRN
  Administered 2022-05-08: 100 mg via INTRAVENOUS
  Administered 2022-05-08: 50 mg via INTRAVENOUS

## 2022-05-08 MED ORDER — OXYCODONE HCL 5 MG/5ML PO SOLN: 5.0000 mg | Freq: Once | ORAL | Status: AC | PRN

## 2022-05-08 MED ORDER — GABAPENTIN 300 MG PO CAPS
ORAL_CAPSULE | ORAL | Status: AC
  Administered 2022-05-08: 300 mg via ORAL
  Filled 2022-05-08: qty 1

## 2022-05-08 MED ORDER — EPHEDRINE 5 MG/ML INJ
INTRAVENOUS | Status: AC
  Filled 2022-05-08: qty 5

## 2022-05-08 MED ORDER — CELECOXIB 200 MG PO CAPS
ORAL_CAPSULE | ORAL | Status: AC
  Administered 2022-05-08: 400 mg via ORAL
  Filled 2022-05-08: qty 1

## 2022-05-08 MED ORDER — ROCURONIUM BROMIDE 10 MG/ML (PF) SYRINGE
PREFILLED_SYRINGE | INTRAVENOUS | Status: AC
  Filled 2022-05-08: qty 10

## 2022-05-08 SURGICAL SUPPLY — 55 items
ADH SKN CLS APL DERMABOND .7 (GAUZE/BANDAGES/DRESSINGS) ×3
APL PRP STRL LF DISP 70% ISPRP (MISCELLANEOUS) ×3
BLADE SURG SZ11 CARB STEEL (BLADE) ×3 IMPLANT
CATH ROBINSON RED A/P 16FR (CATHETERS) ×3 IMPLANT
CHLORAPREP W/TINT 26 (MISCELLANEOUS) ×3 IMPLANT
CORD MONOPOLAR M/FML 12FT (MISCELLANEOUS) IMPLANT
CUP MEDICINE 2OZ PLAST GRAD ST (MISCELLANEOUS) ×3 IMPLANT
DERMABOND ADVANCED .7 DNX12 (GAUZE/BANDAGES/DRESSINGS) ×3 IMPLANT
DRAPE UNDER BUTTOCK W/FLU (DRAPES) ×3 IMPLANT
DRSG TELFA 3X8 NADH STRL (GAUZE/BANDAGES/DRESSINGS) ×3 IMPLANT
GAUZE 4X4 16PLY ~~LOC~~+RFID DBL (SPONGE) ×6 IMPLANT
GLOVE BIO SURGEON STRL SZ 6.5 (GLOVE) ×3 IMPLANT
GLOVE PI ORTHO PRO STRL 7.5 (GLOVE) ×3 IMPLANT
GLOVE SURG UNDER LTX SZ7 (GLOVE) ×3 IMPLANT
GOWN STRL REUS W/ TWL LRG LVL3 (GOWN DISPOSABLE) ×9 IMPLANT
GOWN STRL REUS W/TWL LRG LVL3 (GOWN DISPOSABLE) ×9
GOWN STRL REUS W/TWL XL LVL4 (GOWN DISPOSABLE) ×3 IMPLANT
GRASPER SUT TROCAR 14GX15 (MISCELLANEOUS) IMPLANT
IRRIGATION STRYKERFLOW (MISCELLANEOUS) ×3 IMPLANT
IRRIGATOR STRYKERFLOW (MISCELLANEOUS) ×3
IV LACTATED RINGERS 1000ML (IV SOLUTION) ×3 IMPLANT
KIT PINK PAD W/HEAD ARE REST (MISCELLANEOUS) ×3
KIT PINK PAD W/HEAD ARM REST (MISCELLANEOUS) ×3 IMPLANT
KIT TURNOVER CYSTO (KITS) ×3 IMPLANT
LIGASURE LAP MARYLAND 5MM 37CM (ELECTROSURGICAL) IMPLANT
MANIFOLD NEPTUNE II (INSTRUMENTS) ×3 IMPLANT
Mirena Intrauterine System IMPLANT
NS IRRIG 500ML POUR BTL (IV SOLUTION) ×3 IMPLANT
PACK DNC HYST (MISCELLANEOUS) ×3 IMPLANT
PACK GYN LAPAROSCOPIC (MISCELLANEOUS) ×3 IMPLANT
PAD OB MATERNITY 4.3X12.25 (PERSONAL CARE ITEMS) ×3 IMPLANT
PAD PREP 24X41 OB/GYN DISP (PERSONAL CARE ITEMS) ×3 IMPLANT
POUCH ENDO CATCH 10MM SPEC (MISCELLANEOUS) IMPLANT
SCISSORS METZENBAUM CVD 33 (INSTRUMENTS) IMPLANT
SCRUB CHG 4% DYNA-HEX 4OZ (MISCELLANEOUS) ×3 IMPLANT
SET CYSTO W/LG BORE CLAMP LF (SET/KITS/TRAYS/PACK) IMPLANT
SET TUBE SMOKE EVAC HIGH FLOW (TUBING) ×3 IMPLANT
SHEARS HARMONIC ACE PLUS 36CM (ENDOMECHANICALS) IMPLANT
SLEEVE Z-THREAD 5X100MM (TROCAR) ×3 IMPLANT
SOL PREP PVP 2OZ (MISCELLANEOUS) ×3
SOLUTION PREP PVP 2OZ (MISCELLANEOUS) ×3 IMPLANT
SUT VIC AB 3-0 SH 27 (SUTURE)
SUT VIC AB 3-0 SH 27X BRD (SUTURE) IMPLANT
SUT VIC AB 4-0 FS2 27 (SUTURE) ×3 IMPLANT
SUT VICRYL 0 UR6 27IN ABS (SUTURE) ×3 IMPLANT
SYS BAG RETRIEVAL 10MM (BASKET)
SYS MIRENA INTRAUTERINE (MISCELLANEOUS) ×3
SYSTEM BAG RETRIEVAL 10MM (BASKET) IMPLANT
SYSTEM MIRENA INTRAUTERINE (MISCELLANEOUS) IMPLANT
TOWEL OR 17X26 4PK STRL BLUE (TOWEL DISPOSABLE) ×3 IMPLANT
TRAP FLUID SMOKE EVACUATOR (MISCELLANEOUS) ×3 IMPLANT
TROCAR XCEL NON-BLD 5MMX100MML (ENDOMECHANICALS) ×3 IMPLANT
TROCAR XCEL UNIV SLVE 11M 100M (ENDOMECHANICALS) IMPLANT
TROCAR Z-THRD FIOS HNDL 11X100 (TROCAR) ×3 IMPLANT
WATER STERILE IRR 500ML POUR (IV SOLUTION) ×3 IMPLANT

## 2022-05-08 NOTE — Transfer of Care (Signed)
Immediate Anesthesia Transfer of Care Note  Patient: Tara Osborn  Procedure(s) Performed: INTRAUTERINE DEVICE (IUD) REMOVAL (Uterus) INTRAUTERINE DEVICE (IUD) INSERTION (Uterus) LAPAROSCOPIC BILATERAL SALPINGECTOMY (Bilateral: Pelvis) RIGHT OVARIAN CYSTECTOMY (Right: Pelvis)  Patient Location: PACU  Anesthesia Type:General  Level of Consciousness: awake, alert , and oriented  Airway & Oxygen Therapy: Patient Spontanous Breathing and Patient connected to face mask oxygen  Post-op Assessment: Report given to RN and Post -op Vital signs reviewed and stable  Post vital signs: Reviewed and stable  Last Vitals:  Vitals Value Taken Time  BP 129/83 05/08/22 1423  Temp    Pulse 107 05/08/22 1427  Resp 21 05/08/22 1427  SpO2 100 % 05/08/22 1427  Vitals shown include unvalidated device data.  Last Pain:  Vitals:   05/08/22 1157  TempSrc: Temporal  PainSc: 0-No pain         Complications: No notable events documented.

## 2022-05-08 NOTE — Op Note (Signed)
Procedure(s): INTRAUTERINE DEVICE (IUD) REMOVAL INTRAUTERINE DEVICE (IUD) INSERTION LAPAROSCOPIC BILATERAL SALPINGECTOMY  Procedure Note  Tara Osborn female 36 y.o. 05/08/2022  Indications: The patient is a 36 y.o. G35P2002 female with undesired fertility, IUD in place, increased risk for breast and ovarian cancer due to genetic testing. Small dermoid cyst on right ovary (2 cm on recent ultrasound).   Pre-operative Diagnosis: Undesired fertility, IUD in place, increased risk for breast and ovarian cancer due to genetic testing. Dermoid cyst on right ovary.   Post-operative Diagnosis: Same  Surgeon: Rubie Maid, MD  Assistants:  None.   Anesthesia: General endotracheal anesthesia  Findings: The uterus was sounded to 9 cm. Fallopian tubes and ovaries overall appeared normal.  Possible small ovarian cyst on right but rather indistinguishable from normal ovarian tissue.   Procedure Details: The patient was seen in the Holding Room. The risks, benefits, complications, treatment options, and expected outcomes were discussed with the patient.  The patient concurred with the proposed plan, giving informed consent.  The site of surgery properly noted/marked. The patient was taken to the Operating Room, identified as Tara Osborn and the procedure verified as Procedure(s) (LRB): INTRAUTERINE DEVICE (IUD) REMOVAL (N/A), INTRAUTERINE DEVICE (IUD) INSERTION (N/A) LAPAROSCOPIC BILATERAL SALPINGECTOMY (Bilateral), POSSIBLE RIGHT OVARIAN CYSTECTOMY (Right).   She was then placed under general anesthesia without difficulty. She was placed in the dorsal lithotomy position, and was prepped and draped in a sterile manner.  A Time Out was held and the above information confirmed.  A straight catheterization was performed. A sterile speculum was inserted into the vagina and the cervix was grasped at the anterior lip using a single-toothed tenaculum.  The uterus was sounded to 9 cm, and a Hulka clamp  was placed for uterine manipulation. IUD threads were visualized  ~ 3 cm beyond external cervical os at time of insertion of the Hulka clamp.  The speculum and tenaculum were then removed. Attention was turned to the abdomen where an umbilical incision was made with the scalpel.  The Optiview 5-mm trocar and sleeve were then advanced without difficulty with the laparoscope under direct visualization into the abdomen.  The abdomen was then insufflated with carbon dioxide gas and adequate pneumoperitoneum was obtained. A 5-mm right lower quadrant port and an 5-mm suprapubic port were then placed under direct visualization.  A survey of the patient's pelvis and abdomen revealed the findings as above.  On the right side, the left fallopian tube was grasped and the mesosalpinx was clamped and transected with the Ligasure device. This procedure was then repeated on the right side.  The fallopian tubes were then removed through the right laparoscopic port. The decision was made not to explore the right ovary for the cyst due to very small size and likely difficulty identifying plane for separation.  A final survey was performed, where good hemostasis was noted throughout.  All trocars were removed under direct visualization, and the abdomen which was desufflated.    All skin incisions were closed with 4-0 Vicryl subcuticular stitches.  All incisions were injected with a total of 15 ml of 0.5% Sensorcaine. Dermabond was placed over the incisions.   Attention was then turned to the pelvis.  The Hulka manipulator was removed.  A sterile speculum was then placed in the vagina. The strings of the IUD were grasped and pulled using Kelly forceps. The IUD was successfully removed in its entirety.  A tenaculum was then used to grasp the anterior lip of the cervix. The  new Mirena IUD insertion was then placed per manufacturer's recommendations. Strings were trimmed to 3 cm. The tenaculum was removed, good hemostasis noted.  Patient tolerated procedure well.   The patient tolerated the procedures well.  All instruments, needles, and sponge counts were correct x 2. The patient was taken to the recovery room awake, extubated and in stable condition.    Estimated Blood Loss:  minimal      Drains: straight catheterization prior to procedure with 200 ml of clear urine         Total IV Fluids:  800 ml  Specimens: Bilateral fallopian tubes.          Implants: None         Complications:  None; patient tolerated the procedure well.         Disposition: PACU - hemodynamically stable.         Condition: stable   Rubie Maid, MD [facility] at [facility]

## 2022-05-08 NOTE — Discharge Instructions (Signed)

## 2022-05-08 NOTE — Anesthesia Postprocedure Evaluation (Signed)
Anesthesia Post Note  Patient: Tara Osborn  Procedure(s) Performed: INTRAUTERINE DEVICE (IUD) REMOVAL (Uterus) INTRAUTERINE DEVICE (IUD) INSERTION (Uterus) LAPAROSCOPIC BILATERAL SALPINGECTOMY (Bilateral: Pelvis) RIGHT OVARIAN CYSTECTOMY (Right: Pelvis)  Patient location during evaluation: PACU Anesthesia Type: General Level of consciousness: awake and alert Pain management: pain level controlled Vital Signs Assessment: post-procedure vital signs reviewed and stable Respiratory status: spontaneous breathing, nonlabored ventilation, respiratory function stable and patient connected to nasal cannula oxygen Cardiovascular status: blood pressure returned to baseline and stable Postop Assessment: no apparent nausea or vomiting Anesthetic complications: no  No notable events documented.   Last Vitals:  Vitals:   05/08/22 1423 05/08/22 1430  BP: 129/83 126/81  Pulse: (!) 107   Resp: (!) 25   Temp: 36.6 C   SpO2: 100%     Last Pain:  Vitals:   05/08/22 1430  TempSrc:   PainSc: [place]

## 2022-05-08 NOTE — Progress Notes (Signed)
Patient awake/alert x4. Does verbalize "sleepy, can't wake up"   Noted heart rate 104-120, Dr. Barbra Sarks: anesthesia called, no new orders, also zofran ordered for nausea as needed.  Patient tolerated water, soda and few crackers.  Will continue to monitor.

## 2022-05-08 NOTE — Interval H&P Note (Signed)
History and Physical Interval Note:  05/08/2022 11:58 AM  Tara Osborn  has presented today for surgery, with the diagnosis of expired IUD increased risk for breast/ovarian cancer right ovarian cyst.  The various methods of treatment have been discussed with the patient and family. After consideration of risks, benefits and other options for treatment, the patient has consented to  Procedure(s): INTRAUTERINE DEVICE (IUD) REMOVAL (N/A) INTRAUTERINE DEVICE (IUD) INSERTION (N/A) LAPAROSCOPIC BILATERAL SALPINGECTOMY (Bilateral) RIGHT OVARIAN CYSTECTOMY (Right) as a surgical intervention.  The patient's history has been reviewed, patient examined, no change in status, stable for surgery.  I have reviewed the patient's chart and labs.  Questions were answered to the patient's satisfaction.     Rubie Maid, MD [facility] at [facility]

## 2022-05-08 NOTE — Anesthesia Preprocedure Evaluation (Signed)
Anesthesia Evaluation  Patient identified by MRN, date of birth, ID band Patient awake    Reviewed: Allergy & Precautions, NPO status , Patient's Chart, lab work & pertinent test results  History of Anesthesia Complications Negative for: history of anesthetic complications  Airway Mallampati: II  TM Distance: >3 FB Neck ROM: full    Dental  (+) Dental Advidsory Given, Teeth Intact   Pulmonary neg pulmonary ROS, neg sleep apnea, neg COPD   Pulmonary exam normal        Cardiovascular (-) angina (-) Past MI and (-) CABG negative cardio ROS Normal cardiovascular exam     Neuro/Psych  PSYCHIATRIC DISORDERS      negative neurological ROS     GI/Hepatic negative GI ROS, Neg liver ROS,,,  Endo/Other  Hypothyroidism    Renal/GU      Musculoskeletal   Abdominal   Peds  Hematology negative hematology ROS (+)   Anesthesia Other Findings Past Medical History: No date: Anxiety No date: Family history of breast cancer 11/2016: Genetic testing of female     Comment:  BRIP1 POSITIVE No date: Headache No date: High risk of ovarian cancer No date: Hypothyroidism 12/2016: Increased risk of breast cancer     Comment:  based on family hx-->IBIS=23.9%, riskscore=20.4% 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     Comment:  [employer] MyRisk; increased ovar cancer risk  Past Surgical History: No date: OTHER SURGICAL HISTORY     Comment:  Surgery in foot  BMI    Body Mass Index: 30.30 kg/m      Reproductive/Obstetrics negative OB ROS                             Anesthesia Physical Anesthesia Plan  ASA: 2  Anesthesia Plan: General ETT   Post-op Pain Management:    Induction: Intravenous  PONV Risk Score and Plan: 4 or greater and Midazolam, Dexamethasone and Ondansetron  Airway Management Planned: Oral ETT  Additional Equipment:   Intra-op Plan:   Post-operative Plan: Extubation in  OR  Informed Consent: I have reviewed the patients History and Physical, chart, labs and discussed the procedure including the risks, benefits and alternatives for the proposed anesthesia with the patient or authorized representative who has indicated his/her understanding and acceptance.     Dental Advisory Given  Plan Discussed with: Anesthesiologist, CRNA and Surgeon  Anesthesia Plan Comments: (Patient consented for risks of anesthesia including but not limited to:  - adverse reactions to medications - damage to eyes, teeth, lips or other oral mucosa - nerve damage due to positioning  - sore throat or hoarseness - Damage to heart, brain, nerves, lungs, other parts of body or loss of life  Patient voiced understanding.)       Anesthesia Quick Evaluation

## 2022-05-08 NOTE — Anesthesia Procedure Notes (Signed)
Procedure Name: Intubation Date/Time: 05/08/2022 1:03 PM  Performed by: Jerrye Noble, CRNAPre-anesthesia Checklist: Patient identified, Emergency Drugs available, Suction available, Patient being monitored and Timeout performed Patient Re-evaluated:Patient Re-evaluated prior to induction Oxygen Delivery Method: Circle system utilized Preoxygenation: Pre-oxygenation with 100% oxygen Induction Type: IV induction Ventilation: Mask ventilation without difficulty Laryngoscope Size: McGraph, Mac and 3 Grade View: Grade I Tube type: Oral Tube size: 6.5 mm Airway Equipment and Method: Stylet Placement Confirmation: ETT inserted through vocal cords under direct vision, positive ETCO2 and breath sounds checked- equal and bilateral Secured at: 19 cm Tube secured with: Tape Dental Injury: Teeth and Oropharynx as per pre-operative assessment

## 2022-05-09 ENCOUNTER — Encounter: Payer: Self-pay | Admitting: Obstetrics and Gynecology

## 2022-05-10 LAB — SURGICAL PATHOLOGY

## 2022-05-15 NOTE — Progress Notes (Unsigned)
    OBSTETRICS/GYNECOLOGY POST-OPERATIVE CLINIC VISIT  Subjective:     Tara Osborn is a 36 y.o. female who presents to the clinic 1 weeks status post  IUD removal, IUD insertion, Laparoscopic Bilateral Salpingectomy, Right Ovarian Cystectomy  for expired IUD increased risk for breast/ovarian cancer right ovarian cyst . Eating a regular diet {with-without:5700} difficulty. Bowel movements are {normal/abnormal***:19619}. {pain control:13522::"The patient is not having any pain."}  {Common ambulatory SmartLinks:19316}  Review of Systems {ros; complete:30496}   Objective:   There were no vitals taken for this visit. There is no height or weight on file to calculate BMI.  General:  alert and no distress  Abdomen: soft, bowel sounds active, non-tender  Incision:   {incision:13716::"no dehiscence","incision well approximated","healing well","no drainage","no erythema","no hernia","no seroma","no swelling"}    Pathology:    Assessment:   Patient s/p IUD removal, IUD insertion, Laparoscopic Bilateral Salpingectomy, Right Ovarian Cystectomy (surgery)  {doing well:13525::"Doing well postoperatively."}   Plan:   1. Continue any current medications as instructed by provider. 2. Wound care discussed. 3. Operative findings again reviewed. Pathology report discussed. 4. Activity restrictions: {restrictions:13723} 5. Anticipated return to work: {work return:14002}. 6. Follow up: {4-09:81191} {time; units:18646} for ***    Rubie Maid, MD [place]

## 2022-05-16 ENCOUNTER — Ambulatory Visit (INDEPENDENT_AMBULATORY_CARE_PROVIDER_SITE_OTHER): Payer: BC Managed Care – PPO | Admitting: Obstetrics and Gynecology

## 2022-05-16 ENCOUNTER — Encounter: Payer: Self-pay | Admitting: Obstetrics and Gynecology

## 2022-05-16 VITALS — BP 128/76 | HR 89 | Resp 16 | Ht 59.0 in | Wt 150.2 lb

## 2022-05-16 DIAGNOSIS — Z4889 Encounter for other specified surgical aftercare: Secondary | ICD-10-CM

## 2022-05-16 DIAGNOSIS — Z9079 Acquired absence of other genital organ(s): Secondary | ICD-10-CM

## 2022-05-16 DIAGNOSIS — Z975 Presence of (intrauterine) contraceptive device: Secondary | ICD-10-CM

## 2022-05-17 ENCOUNTER — Ambulatory Visit
Admission: RE | Admit: 2022-05-17 | Discharge: 2022-05-17 | Disposition: A | Discharge status: Home / Self Care | Payer: BC Managed Care – PPO | Source: Ambulatory Visit | Attending: Obstetrics and Gynecology | Admitting: Obstetrics and Gynecology

## 2022-05-17 DIAGNOSIS — R928 Other abnormal and inconclusive findings on diagnostic imaging of breast: Secondary | ICD-10-CM

## 2022-05-17 DIAGNOSIS — N63 Unspecified lump in unspecified breast: Secondary | ICD-10-CM | POA: Insufficient documentation

## 2022-05-17 DIAGNOSIS — N6011 Diffuse cystic mastopathy of right breast: Secondary | ICD-10-CM | POA: Diagnosis not present

## 2022-05-17 HISTORY — PX: BREAST BIOPSY: SHX20

## 2022-05-17 MED ORDER — LIDOCAINE HCL (PF) 1 % IJ SOLN
10.0000 mL | Freq: Once | INTRAMUSCULAR | Status: AC
  Administered 2022-05-17: 10 mL

## 2022-05-17 MED ORDER — LIDOCAINE HCL (PF) 1 % IJ SOLN
8.0000 mL | Freq: Once | INTRAMUSCULAR | Status: AC
  Administered 2022-05-17: 8 mL
  Filled 2022-05-17: qty 8

## 2022-05-19 LAB — SURGICAL PATHOLOGY

## 2022-05-22 ENCOUNTER — Encounter: Payer: Self-pay | Admitting: *Deleted

## 2022-05-22 DIAGNOSIS — N6489 Other specified disorders of breast: Secondary | ICD-10-CM

## 2022-05-22 NOTE — Progress Notes (Signed)
Referral recieved from [facility] for benign breast mass.  Referral sent to [place] [facility] for Dr. Dahlia Byes per patient request.  No further needs at this time.

## 2022-05-24 ENCOUNTER — Encounter: Payer: Self-pay | Admitting: Surgery

## 2022-05-24 ENCOUNTER — Ambulatory Visit: Payer: BC Managed Care – PPO | Admitting: Surgery

## 2022-05-24 ENCOUNTER — Other Ambulatory Visit: Payer: Self-pay

## 2022-05-24 VITALS — BP 130/80 | HR 81 | Temp 98.3°F | Ht 59.0 in | Wt 148.0 lb

## 2022-05-24 DIAGNOSIS — N6489 Other specified disorders of breast: Secondary | ICD-10-CM

## 2022-05-24 NOTE — Patient Instructions (Addendum)
[facility] will call to schedule the Breast MRI. Please call our office if you do not hear from someone by Monday of Next week. Thank you.  Lumpectomy  A lumpectomy, sometimes called a partial mastectomy, is surgery to remove a cancerous tumor or mass (the lump) from a breast. It is a form of breast-conserving or breast-preservation surgery. This means that the cancerous tissue is removed but the breast remains intact. During a lumpectomy, the portion of the breast that contains the tumor is removed. Lymph nodes under your arm may also be removed. Lymph nodes are part of the body's disease-fighting system (immune system) and are usually the first place where breast cancer spreads. Tell a health care provider about: Any allergies you have. All medicines you are taking, including vitamins, herbs, eye drops, creams, and over-the-counter medicines. Any problems you or family members have had with anesthetic medicines. Any bleeding problems you have. Any surgeries you have had. Any medical conditions you have. Whether you are pregnant or may be pregnant. What are the risks? Generally, this is a safe procedure. However, problems may occur, including: Bleeding. Infection. Allergic reaction to medicines. Pain, swelling, weakness, or numbness in the arm on the side of your surgery. Temporary swelling. Change in the shape of the breast, especially if a large portion is removed. Scar tissue that forms at the surgical site and feels hard to the touch. Blood clots. What happens before the procedure? When to stop eating and drinking Follow instructions from your health care provider about what you may eat and drink. These may include: 8 hours before your procedure Stop eating most foods. Do not eat meat, fried foods, or fatty foods. Eat only light foods, such as toast or crackers. All liquids are okay except energy drinks and alcohol. 6 hours before your procedure Stop eating. Drink  only clear liquids, such as water, clear fruit juice, black coffee, plain tea, and sports drinks. Do not drink energy drinks or alcohol. 2 hours before your procedure Stop drinking all liquids. You may be allowed to take medicines with small sips of water. If you do not follow your health care provider's instructions, your procedure may be delayed or canceled. Medicines Ask your health care provider about: Changing or stopping your regular medicines. These include any diabetes medicines or blood thinners you take. Taking medicines such as aspirin and ibuprofen. These medicines can thin your blood. Do not take them unless your health care provider tells you to. Taking over-the-counter medicines, vitamins, herbs, and supplements. Surgery safety Ask your health care provider how your surgery site will be marked. A procedure may be done to locate and mark the tumor area in the breast (localization). This will guide the surgeon to where the incision will be made. This may be done with: Imaging, such as a mammogram, ultrasound, or MRI. Insertion of a small wire, clip, or seed, or an implant that will reflect a radar signal. Also, ask what steps will be taken to help prevent infection. These may include: Washing skin with a germ-killing soap. Taking antibiotic medicine. General instructions You may have screening tests or exams to get normal measurements of your arm, also called baseline measurements. These can be compared to measurements done after surgery to monitor for swelling (lymphedema) that can develop after having lymph nodes removed. If you will be going home right after the procedure, plan to have a responsible adult: Take you home from the hospital or clinic. You will not be allowed to drive.  Care for you for the time you are told. What happens during the procedure?  An IV will be inserted into one of your veins. You may be given: A sedative. This helps you relax. Anesthesia. This  will: Numb certain areas of your body. Make you fall asleep for surgery. An electric scalpel will be used to reduce bleeding (electrocautery knife). A curved incision that follows the natural curve of your breast will be made. The tumor will be removed along with some of the tissue around it. This will be sent to the lab for testing. Your health care provider may also remove lymph nodes at this time if needed. If the tumor is close to the muscles over your chest, some muscle tissue may also be removed. A small drain tube may be inserted into your breast area or armpit to collect fluid that may build up after surgery. This tube will be connected to a suction bulb on the outside of your body to remove the fluid. The incision will be closed with stitches (sutures). A bandage (dressing) may be placed over the incision. The procedure may vary among health care providers and hospitals. What happens after the procedure? Your blood pressure, heart rate, breathing rate, and blood oxygen level will be monitored until you leave the hospital or clinic. You will be given medicine for pain as needed. You will be encouraged to get up and walk as soon as you can. This will improve blood flow and breathing. Ask for help if you feel weak or unsteady. You may have a drain tube in place for 2-3 days to prevent a collection of blood (hematoma) from developing in the breast. You may have a pressure bandage applied for 1-2 days to prevent bleeding or swelling. Ask your health care provider how to care for your bandage at home. You may be given a tight sleeve to wear over your arm on the side of your surgery. Wear the sleeve as told by your health care provider. Do not drive or operate machinery until your health care provider says that it is safe. Where to find more information American Cancer Society: cancer.[place]: cancer.gov Summary A lumpectomy, sometimes called a partial mastectomy, is  surgery to remove a cancerous tumor or mass (the lump) from a breast. During a lumpectomy, the portion of the breast that contains the tumor is removed. Plan to have someone take you home from the hospital or clinic. You may have a drain tube in place for 2-3 days to prevent a collection of blood (hematoma) from developing in the breast. This information is not intended to replace advice given to you by your health care provider. Make sure you discuss any questions you have with your health care provider. Document Revised: 08/29/2021 Document Reviewed: 08/14/2021 Elsevier Patient Education  [place].

## 2022-05-25 NOTE — Progress Notes (Signed)
Patient ID: Tara Osborn, female   DOB: [birth year], 36 y.o.   MRN: [id number]  HPI Tara Osborn is a 36 y.o. female  [id number] , Monoallelic mutation of BRIP1 gene.    She has a Sister with triple negative breast CA at age 40, she has a niece that had colon CA as well . Same BRIP1 .  She does have family Ashkenazi jew . She  she is seen in consultation at the request of Ms. Copland PA-C Hx of lap bilateral salpingectomy 04/2022. Recent mammo and U/S personally reviewed shows Right asymmetry path c/w radial scar . She specifically denies any breast pain, discomfort, no nipple DC. She had two lesions Right outer upper quadrant and there are close to each other. Clips are very close. Final path shows radial scar on Site A located 830 8 cms from nipple ( ribbon clip)  He is otherwise healthy and is able to perform more than 4 METS of activity without any shortness of breath or chest pain CBC and CMP nml  HPI  Past Medical History:  Diagnosis Date   Anxiety    Family history of breast cancer    Genetic testing of female 11/2016   BRIP1 POSITIVE   Headache    High risk of ovarian cancer    Hypothyroidism    Increased risk of breast cancer 12/2016   based on family hx-->IBIS=23.9%, VQQVZDGLO=75.6%   Monoallelic mutation of BRIP1 gene 12/2016   Myriad MyRisk; increased ovar cancer risk    Past Surgical History:  Procedure Laterality Date   BREAST BIOPSY Right 05/17/2022   [place] Core Bx,ribbon clip 830 path pending   BREAST BIOPSY Right 05/17/2022   [place] Core Bx, coil clip 9:00, path pending   BREAST BIOPSY Right 05/17/2022   [place] RT BREAST BX W LOC DEV 1ST LESION IMG BX SPEC US GUIDE 05/17/2022 [facility]   BREAST BIOPSY Right 05/17/2022   [place] RT BREAST BX W LOC DEV EA ADD LESION IMG BX SPEC US GUIDE 05/17/2022 [facility]   INTRAUTERINE DEVICE (IUD) INSERTION N/A 05/08/2022   Procedure: INTRAUTERINE DEVICE (IUD) INSERTION;  Surgeon: Rubie Maid, MD;  Location: [facility];   Service: Gynecology;  Laterality: N/A;   IUD REMOVAL N/A 05/08/2022   Procedure: INTRAUTERINE DEVICE (IUD) REMOVAL;  Surgeon: Rubie Maid, MD;  Location: [facility];  Service: Gynecology;  Laterality: N/A;   LAPAROSCOPIC BILATERAL SALPINGECTOMY Bilateral 05/08/2022   Procedure: LAPAROSCOPIC BILATERAL SALPINGECTOMY;  Surgeon: Rubie Maid, MD;  Location: [facility];  Service: Gynecology;  Laterality: Bilateral;   LAPAROSCOPIC OVARIAN CYSTECTOMY Right 05/08/2022   Procedure: RIGHT OVARIAN CYSTECTOMY;  Surgeon: Rubie Maid, MD;  Location: [facility];  Service: Gynecology;  Laterality: Right;   OTHER SURGICAL HISTORY     Surgery in foot    Family History  Problem Relation Age of Onset   Breast cancer Sister 33       triple negative; currently 87   Hypothyroidism Sister    Hypertension Mother    Hypothyroidism Mother    Stroke Mother    Hypertension Father    Hypothyroidism Father    Other Maternal Grandfather        myocardial infarction   Other Paternal Grandfather        myocardial infarction   Colon cancer Niece 18    Social History Social History   Tobacco Use   Smoking status: Never   Smokeless tobacco: Never  Vaping Use   Vaping Use: Never used  Substance Use Topics  Alcohol use: No   Drug use: No    No Known Allergies  Current Outpatient Medications  Medication Sig Dispense Refill   acetaminophen (TYLENOL) 500 MG tablet Take 500 mg by mouth every 8 (eight) hours as needed for mild pain.     cetirizine (ZYRTEC) 10 MG tablet Take 10 mg by mouth daily.     ibuprofen (ADVIL) 600 MG tablet Take 1 tablet (600 mg total) by mouth every 6 (six) hours as needed. 60 tablet 0   levonorgestrel (MIRENA) 20 MCG/24HR IUD 1 each by Intrauterine route once.     levothyroxine (SYNTHROID) 75 MCG tablet Take 1 tablet (75 mcg total) by mouth daily. 90 tablet 3   sertraline (ZOLOFT) 100 MG tablet TAKE 1.5 TABLETS (150MG TOTAL) BY MOUTH DAILY 135 tablet 3   No current  facility-administered medications for this visit.     Review of Systems Full ROS  was asked and was negative except for the information on the HPI  Physical Exam Blood pressure 130/80, pulse 81, temperature 98.3 F (36.8 C), temperature source Oral, height _0  (1.499 m), weight 148 lb (67.1 kg), SpO2 98 %. CONSTITUTIONAL: NAD. EYES: Pupils are equal, round, and, Sclera are non-icteric. EARS, NOSE, MOUTH AND THROAT: The oropharynx is clear. The oral mucosa is pink and moist. Hearing is intact to voice. LYMPH NODES:  Lymph nodes in the neck are normal. BREAST : No evidence of any palpable lesions in either breast.  Nipple and skin is normal.  Bilateral axillas are free of any lymphadenopathy RESPIRATORY:  Lungs are clear. There is normal respiratory effort, with equal breath sounds bilaterally, and without pathologic use of accessory muscles. CARDIOVASCULAR: Heart is regular without murmurs, gallops, or rubs. GI: The abdomen is  soft, nontender, and nondistended. There are no palpable masses. There is no hepatosplenomegaly. There are normal bowel sounds in all quadrants. GU: Rectal deferred.   MUSCULOSKELETAL: Normal muscle strength and tone. No cyanosis or edema.   SKIN: Turgor is good and there are no pathologic skin lesions or ulcers. NEUROLOGIC: Motor and sensation is grossly normal. Cranial nerves are grossly intact. PSYCH:  Oriented to person, place and time. Affect is normal.  Data Reviewed  I have personally reviewed the patient's imaging, laboratory findings and medical records.    Assessment/Plan 36 year old female with 2 asymmetries on the right breast one of them consistent with radial scar.  She does have BRIP1 gene.  Given significant breast risk factors I recommend MRI before performing a lumpectomy as there might be some additional lesions that may warrant further intervention.  I had an extensive discussion with her in detail.  If the MRI failed to reveal any other  concerning lesions I do think that we can proceed with excision under wireless guidance on the right breast.  I do think that we will be able to remove both clips within the same specimen.  We discussed with her in detail about the procedure the risk the benefits and the possible complications.  As I stated I do want to wait for an MRI before committing to surgical intervention.  I explained to her in detail about my thought process and she is in agreement and very appreciative Please note that I spent 55 minutes in the encounter including personally reviewing imaging studies, coordinating her care, placing orders, performing appropriate documentation. Copy of the report sent to referring provider Copy of this report was sent to the referring provider  Caroleen Hamman, MD FACS General Surgeon 05/25/2022,  7:54 AM

## 2022-05-26 ENCOUNTER — Encounter: Payer: Self-pay | Admitting: Surgery

## 2022-06-02 ENCOUNTER — Ambulatory Visit
Admission: RE | Admit: 2022-06-02 | Discharge: 2022-06-02 | Disposition: A | Discharge status: Home / Self Care | Payer: BC Managed Care – PPO | Source: Ambulatory Visit | Attending: Surgery | Admitting: Surgery

## 2022-06-02 DIAGNOSIS — N6489 Other specified disorders of breast: Secondary | ICD-10-CM | POA: Insufficient documentation

## 2022-06-02 MED ORDER — GADOBUTROL 1 MMOL/ML IV SOLN
6.0000 mL | Freq: Once | INTRAVENOUS | Status: AC | PRN
  Administered 2022-06-02: 7.5 mL via INTRAVENOUS

## 2022-06-05 ENCOUNTER — Other Ambulatory Visit: Payer: Self-pay | Admitting: Surgery

## 2022-06-05 DIAGNOSIS — R928 Other abnormal and inconclusive findings on diagnostic imaging of breast: Secondary | ICD-10-CM

## 2022-06-05 NOTE — Addendum Note (Signed)
Addended by: Caroleen Hamman F on: 06/05/2022 11:50 AM   Modules accepted: Orders

## 2022-06-06 ENCOUNTER — Telehealth: Payer: Self-pay | Admitting: Surgery

## 2022-06-06 NOTE — Telephone Encounter (Signed)
Patient has been advised of Pre-Admission date/time, and Surgery date at [facility].  Surgery Date: 06/13/22 Preadmission Testing Date: 06/07/22 (phone 8a-1p)  Patient has been made aware to call [phone], between 1-3:00pm the day before surgery, to find out what time to arrive for surgery.    Patient also reminded of RF tag placement at the [facility] on 06/07/22.

## 2022-06-07 ENCOUNTER — Ambulatory Visit
Admission: RE | Admit: 2022-06-07 | Discharge: 2022-06-07 | Disposition: A | Discharge status: Home / Self Care | Payer: BC Managed Care – PPO | Source: Ambulatory Visit | Attending: Surgery | Admitting: Surgery

## 2022-06-07 ENCOUNTER — Encounter
Admission: RE | Admit: 2022-06-07 | Discharge: 2022-06-07 | Disposition: A | Discharge status: Home / Self Care | Payer: BC Managed Care – PPO | Source: Ambulatory Visit | Attending: Surgery | Admitting: Surgery

## 2022-06-07 ENCOUNTER — Other Ambulatory Visit: Payer: Self-pay

## 2022-06-07 ENCOUNTER — Ambulatory Visit: Payer: BC Managed Care – PPO | Admitting: Surgery

## 2022-06-07 ENCOUNTER — Encounter: Payer: Self-pay | Admitting: Surgery

## 2022-06-07 VITALS — Ht 59.0 in | Wt 150.0 lb

## 2022-06-07 DIAGNOSIS — R928 Other abnormal and inconclusive findings on diagnostic imaging of breast: Secondary | ICD-10-CM | POA: Diagnosis not present

## 2022-06-07 DIAGNOSIS — N6489 Other specified disorders of breast: Secondary | ICD-10-CM

## 2022-06-07 DIAGNOSIS — Z01818 Encounter for other preprocedural examination: Secondary | ICD-10-CM | POA: Insufficient documentation

## 2022-06-07 HISTORY — PX: BREAST BIOPSY: SHX20

## 2022-06-07 NOTE — Pre-Procedure Instructions (Signed)
Pre op phone interview with patient. Chart reviewed. Medical/surgical history, medications, psychosocial, ADL's, and discharge plan reviewed with patient. Verbal and written instructions thru MyChart given. Patient verbalized understanding. Aware to call with any questions/concerns.  

## 2022-06-07 NOTE — Patient Instructions (Signed)
Your procedure is scheduled on: 06/13/22 Report to [place]. To find out your arrival time please call [phone] between 1PM - 3PM on 06/09/22.  Remember: Instructions that are not followed completely may result in serious medical risk, up to and including death, or upon the discretion of your surgeon and anesthesiologist your surgery may need to be rescheduled.     _X__ 1. Do not eat food or drin any liquids after midnight the night before your procedure.                 No gum chewing or hard candies.   __X__2.  On the morning of surgery brush your teeth with toothpaste and water, you                 may rinse your mouth with mouthwash if you wish.  Do not swallow any              toothpaste of mouthwash.     _X__ 3.  No Alcohol for 24 hours before or after surgery.   _X__ 4.  Do Not Smoke or use e-cigarettes For 24 Hours Prior to Your Surgery.                 Do not use any chewable tobacco products for at least 6 hours prior to                 surgery.  ____  5.  Bring all medications with you on the day of surgery if instructed.   __X__  6.  Notify your doctor if there is any change in your medical condition      (cold, fever, infections).     Do not wear jewelry, make-up, hairpins, clips or nail polish. Do not wear lotions, powders, or perfumes. No deodorant Do not shave body hair 48 hours prior to surgery. Men may shave face and neck. Do not bring valuables to the hospital.    [facility] is not responsible for any belongings or valuables.  Contacts, dentures/partials or body piercings may not be worn into surgery. Bring a case for your contacts, glasses or hearing aids, a denture cup will be supplied. Leave your suitcase in the car. After surgery it may be brought to your room. For patients admitted to the hospital, discharge time is determined by your treatment team.   Patients discharged the day of surgery will  not be allowed to drive home.    __X__ Take these medicines the morning of surgery with A SIP OF WATER:    1. cetirizine (ZYRTEC) 10 MG tablet  2. levothyroxine (SYNTHROID) 75 MCG tablet   3. sertraline (ZOLOFT) 100 MG tablet   4.  5.  6.  ____ Fleet Enema (as directed)   __X__ Use CHG Soap/SAGE wipes as directed  ____ Use inhalers on the day of surgery  ____ Stop metformin/Janumet/Farxiga 2 days prior to surgery    ____ Take 1/2 of usual insulin dose the night before surgery. No insulin the morning          of surgery.   ____ Stop Blood Thinners Coumadin/Plavix/Xarelto/Pleta/Pradaxa/Eliquis/Effient/Aspirin  on   Or contact your Surgeon, Cardiologist or Medical Doctor regarding  ability to stop your blood thinners  __X__ Stop Anti-inflammatories 7 days before surgery such as Advil, Ibuprofen, Motrin,  BC or Goodies Powder, Naprosyn, Naproxen, Aleve, Aspirin   You may use Tylenol if needed  __X__ Stop  all herbals and supplements, fish oil or vitamins  until after surgery.    ____ Bring C-Pap to the hospital.

## 2022-06-07 NOTE — Progress Notes (Signed)
Outpatient Surgical Follow Up  06/07/2022  Tara Osborn is an 36 y.o. female.   Chief Complaint  Patient presents with   Follow-up    Update H&P    HPI: Tara Osborn is a 36 y.o. female  [id number] , Monoallelic mutation of BRIP1 gene.    She has a Sister with triple negative breast CA at age 68, she has a niece that had colon CA as well . Same BRIP1 .  She does have family Ashkenazi jew . She  she is seen in consultation at the request of Ms. Copland PA-C Hx of lap bilateral salpingectomy 04/2022. Recent mammo and U/S personally reviewed shows Right asymmetry path c/w radial scar . She specifically denies any breast pain, discomfort, no nipple DC. She had two lesions Right outer upper quadrant and there are close to each other. Clips are very close. Final path shows radial scar on Site A located 830 8 cms from nipple ( ribbon clip) . I have personally reviewed MRI showing no other concerning lesions I Have discussed the images with the breast radiologist and we think that we can get both a and B sides with 1 RFA tag SHe is otherwise healthy and is able to perform more than 4 METS of activity without any shortness of breath or chest pain CBC and CMP nml  Past Medical History:  Diagnosis Date   Anxiety    Family history of breast cancer    Genetic testing of female 11/2016   BRIP1 POSITIVE   Headache    High risk of ovarian cancer    Hypothyroidism    Increased risk of breast cancer 12/2016   based on family hx-->IBIS=23.9%, SNKNLZJQB=34.1%   Monoallelic mutation of BRIP1 gene 12/2016   Myriad MyRisk; increased ovar cancer risk    Past Surgical History:  Procedure Laterality Date   BREAST BIOPSY Right 05/17/2022   [place] Core Bx,ribbon clip 830 path pending   BREAST BIOPSY Right 05/17/2022   [place] Core Bx, coil clip 9:00, path pending   BREAST BIOPSY Right 05/17/2022   [place] RT BREAST BX W LOC DEV 1ST LESION IMG BX SPEC US GUIDE 05/17/2022 [facility]   BREAST BIOPSY Right  05/17/2022   [place] RT BREAST BX W LOC DEV EA ADD LESION IMG BX SPEC US GUIDE 05/17/2022 [facility]   INTRAUTERINE DEVICE (IUD) INSERTION N/A 05/08/2022   Procedure: INTRAUTERINE DEVICE (IUD) INSERTION;  Surgeon: Rubie Maid, MD;  Location: [facility];  Service: Gynecology;  Laterality: N/A;   IUD REMOVAL N/A 05/08/2022   Procedure: INTRAUTERINE DEVICE (IUD) REMOVAL;  Surgeon: Rubie Maid, MD;  Location: [facility];  Service: Gynecology;  Laterality: N/A;   LAPAROSCOPIC BILATERAL SALPINGECTOMY Bilateral 05/08/2022   Procedure: LAPAROSCOPIC BILATERAL SALPINGECTOMY;  Surgeon: Rubie Maid, MD;  Location: [facility];  Service: Gynecology;  Laterality: Bilateral;   LAPAROSCOPIC OVARIAN CYSTECTOMY Right 05/08/2022   Procedure: RIGHT OVARIAN CYSTECTOMY;  Surgeon: Rubie Maid, MD;  Location: [facility];  Service: Gynecology;  Laterality: Right;   OTHER SURGICAL HISTORY     Surgery in foot    Family History  Problem Relation Age of Onset   Breast cancer Sister 24       triple negative; currently 63   Hypothyroidism Sister    Hypertension Mother    Hypothyroidism Mother    Stroke Mother    Hypertension Father    Hypothyroidism Father    Other Maternal Grandfather        myocardial infarction   Other Paternal  Grandfather        myocardial infarction   Colon cancer Niece 26    Social History:  reports that she has never smoked. She has never used smokeless tobacco. She reports that she does not drink alcohol and does not use drugs.  Allergies: No Known Allergies  Medications reviewed.    ROS Full ROS performed and is otherwise negative other than what is stated in HPI   Physical Exam CONSTITUTIONAL: NAD. EYES: Pupils are equal, round, and, Sclera are non-icteric. EARS, NOSE, MOUTH AND THROAT: The oropharynx is clear. The oral mucosa is pink and moist. Hearing is intact to voice. LYMPH NODES:  Lymph nodes in the neck are normal. BREAST : No evidence of any palpable lesions in  either breast.  Nipple and skin is normal.  Bilateral axillas are free of any lymphadenopathy RESPIRATORY:  Lungs are clear. There is normal respiratory effort, with equal breath sounds bilaterally, and without pathologic use of accessory muscles. CARDIOVASCULAR: Heart is regular without murmurs, gallops, or rubs. GI: The abdomen is  soft, nontender, and nondistended. There are no palpable masses. There is no hepatosplenomegaly. There are normal bowel sounds in all quadrants. GU: Rectal deferred.   MUSCULOSKELETAL: Normal muscle strength and tone. No cyanosis or edema.   SKIN: Turgor is good and there are no pathologic skin lesions or ulcers. NEUROLOGIC: Motor and sensation is grossly normal. Cranial nerves are grossly intact. PSYCH:  Oriented to person, place and time. Affect is normal.   Assessment/Plan: 36-year-old female with 2 asymmetries on the right breast one of them consistent with radial scar.  She does have BRIP1 gene.   I do recommend lumpectomy, I do think that we will be able to remove both clips / lesions within the same specimen.  We discussed with her in detail about the procedure the risk the benefits and the possible complications including but not limited to bleeding, infection, deformity and pain. Please note that I spent 40 minutes in the encounter including personally reviewing imaging studies, coordinating her care, placing orders, performing appropriate documentation. Diego Pabon, MD FACS General Surgeon 

## 2022-06-07 NOTE — Patient Instructions (Addendum)
Please call the number on the blue surgery sheet the day before your surgery to get your arrival time.  Please call with any questions or concerns.   Lumpectomy  A lumpectomy, sometimes called a partial mastectomy, is surgery to remove a cancerous tumor or mass (the lump) from a breast. It is a form of breast-conserving or breast-preservation surgery. This means that the cancerous tissue is removed but the breast remains intact. During a lumpectomy, the portion of the breast that contains the tumor is removed. Lymph nodes under your arm may also be removed. Lymph nodes are part of the body's disease-fighting system (immune system) and are usually the first place where breast cancer spreads. Tell a health care provider about: Any allergies you have. All medicines you are taking, including vitamins, herbs, eye drops, creams, and over-the-counter medicines. Any problems you or family members have had with anesthetic medicines. Any bleeding problems you have. Any surgeries you have had. Any medical conditions you have. Whether you are pregnant or may be pregnant. What are the risks? Generally, this is a safe procedure. However, problems may occur, including: Bleeding. Infection. Allergic reaction to medicines. Pain, swelling, weakness, or numbness in the arm on the side of your surgery. Temporary swelling. Change in the shape of the breast, especially if a large portion is removed. Scar tissue that forms at the surgical site and feels hard to the touch. Blood clots. What happens before the procedure? When to stop eating and drinking Follow instructions from your health care provider about what you may eat and drink. These may include: 8 hours before your procedure Stop eating most foods. Do not eat meat, fried foods, or fatty foods. Eat only light foods, such as toast or crackers. All liquids are okay except energy drinks and alcohol. 6 hours before your procedure Stop eating. Drink  only clear liquids, such as water, clear fruit juice, black coffee, plain tea, and sports drinks. Do not drink energy drinks or alcohol. 2 hours before your procedure Stop drinking all liquids. You may be allowed to take medicines with small sips of water. If you do not follow your health care provider's instructions, your procedure may be delayed or canceled. Medicines Ask your health care provider about: Changing or stopping your regular medicines. These include any diabetes medicines or blood thinners you take. Taking medicines such as aspirin and ibuprofen. These medicines can thin your blood. Do not take them unless your health care provider tells you to. Taking over-the-counter medicines, vitamins, herbs, and supplements. Surgery safety Ask your health care provider how your surgery site will be marked. A procedure may be done to locate and mark the tumor area in the breast (localization). This will guide the surgeon to where the incision will be made. This may be done with: Imaging, such as a mammogram, ultrasound, or MRI. Insertion of a small wire, clip, or seed, or an implant that will reflect a radar signal. Also, ask what steps will be taken to help prevent infection. These may include: Washing skin with a germ-killing soap. Taking antibiotic medicine. General instructions You may have screening tests or exams to get normal measurements of your arm, also called baseline measurements. These can be compared to measurements done after surgery to monitor for swelling (lymphedema) that can develop after having lymph nodes removed. If you will be going home right after the procedure, plan to have a responsible adult: Take you home from the hospital or clinic. You will not be allowed to drive.  Care for you for the time you are told. What happens during the procedure?  An IV will be inserted into one of your veins. You may be given: A sedative. This helps you relax. Anesthesia. This  will: Numb certain areas of your body. Make you fall asleep for surgery. An electric scalpel will be used to reduce bleeding (electrocautery knife). A curved incision that follows the natural curve of your breast will be made. The tumor will be removed along with some of the tissue around it. This will be sent to the lab for testing. Your health care provider may also remove lymph nodes at this time if needed. If the tumor is close to the muscles over your chest, some muscle tissue may also be removed. A small drain tube may be inserted into your breast area or armpit to collect fluid that may build up after surgery. This tube will be connected to a suction bulb on the outside of your body to remove the fluid. The incision will be closed with stitches (sutures). A bandage (dressing) may be placed over the incision. The procedure may vary among health care providers and hospitals. What happens after the procedure? Your blood pressure, heart rate, breathing rate, and blood oxygen level will be monitored until you leave the hospital or clinic. You will be given medicine for pain as needed. You will be encouraged to get up and walk as soon as you can. This will improve blood flow and breathing. Ask for help if you feel weak or unsteady. You may have a drain tube in place for 2-3 days to prevent a collection of blood (hematoma) from developing in the breast. You may have a pressure bandage applied for 1-2 days to prevent bleeding or swelling. Ask your health care provider how to care for your bandage at home. You may be given a tight sleeve to wear over your arm on the side of your surgery. Wear the sleeve as told by your health care provider. Do not drive or operate machinery until your health care provider says that it is safe. Where to find more information American Cancer Society: cancer.[employer]: cancer.gov Summary A lumpectomy, sometimes called a partial mastectomy, is  surgery to remove a cancerous tumor or mass (the lump) from a breast. During a lumpectomy, the portion of the breast that contains the tumor is removed. Plan to have someone take you home from the hospital or clinic. You may have a drain tube in place for 2-3 days to prevent a collection of blood (hematoma) from developing in the breast. This information is not intended to replace advice given to you by your health care provider. Make sure you discuss any questions you have with your health care provider. Document Revised: 08/29/2021 Document Reviewed: 08/14/2021 Elsevier Patient Education  [place].

## 2022-06-07 NOTE — H&P (View-Only) (Signed)
Outpatient Surgical Follow Up  06/07/2022  Tara Osborn is an 36 y.o. female.   Chief Complaint  Patient presents with   Follow-up    Update H&P    HPI: Tara Osborn is a 36 y.o. 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    She has a Sister with triple negative breast CA at age 26, she has a niece that had colon CA as well . Same BRIP1 .  She does have family Ashkenazi jew . She  she is seen in consultation at the request of Ms. Copland PA-C Hx of lap bilateral salpingectomy 04/2022. Recent mammo and U/S personally reviewed shows Right asymmetry path c/w radial scar . She specifically denies any breast pain, discomfort, no nipple DC. She had two lesions Right outer upper quadrant and there are close to each other. Clips are very close. Final path shows radial scar on Site A located 830 8 cms from nipple ( ribbon clip) . I have personally reviewed MRI showing no other concerning lesions I Have discussed the images with the breast radiologist and we think that we can get both a and B sides with 1 RFA tag SHe is otherwise healthy and is able to perform more than 4 METS of activity without any shortness of breath or chest pain CBC and CMP nml  Past Medical History:  Diagnosis Date   Anxiety    Family history of breast cancer    Genetic testing of female 11/2016   BRIP1 POSITIVE   Headache    High risk of ovarian cancer    Hypothyroidism    Increased risk of breast cancer 12/2016   based on family hx-->IBIS=23.9%, riskscore=20.4%   Monoallelic mutation of BRIP1 gene 12/2016   My[facility] MyRisk; increased ovar cancer risk    Past Surgical History:  Procedure Laterality Date   BREAST BIOPSY Right 05/17/2022   US Core Bx,ribbon clip 830 path pending   BREAST BIOPSY Right 05/17/2022   Us Core Bx, coil clip 9:00, path pending   BREAST BIOPSY Right 05/17/2022   US RT BREAST BX W LOC DEV 1ST LESION IMG BX SPEC US GUIDE 05/17/2022 [facility]   BREAST BIOPSY Right  05/17/2022   US RT BREAST BX W LOC DEV EA ADD LESION IMG BX SPEC US GUIDE 05/17/2022 [facility]   INTRAUTERINE DEVICE (IUD) INSERTION N/A 05/08/2022   Procedure: INTRAUTERINE DEVICE (IUD) INSERTION;  Surgeon: Cherry, Anika, MD;  Location: [facility];  Service: Gynecology;  Laterality: N/A;   IUD REMOVAL N/A 05/08/2022   Procedure: INTRAUTERINE DEVICE (IUD) REMOVAL;  Surgeon: Cherry, Anika, MD;  Location: [facility];  Service: Gynecology;  Laterality: N/A;   LAPAROSCOPIC BILATERAL SALPINGECTOMY Bilateral 05/08/2022   Procedure: LAPAROSCOPIC BILATERAL SALPINGECTOMY;  Surgeon: Cherry, Anika, MD;  Location: [facility];  Service: Gynecology;  Laterality: Bilateral;   LAPAROSCOPIC OVARIAN CYSTECTOMY Right 05/08/2022   Procedure: RIGHT OVARIAN CYSTECTOMY;  Surgeon: Cherry, Anika, MD;  Location: [facility];  Service: Gynecology;  Laterality: Right;   OTHER SURGICAL HISTORY     Surgery in foot    Family History  Problem Relation Age of Onset   Breast cancer Sister 26       triple negative; currently 32   Hypothyroidism Sister    Hypertension Mother    Hypothyroidism Mother    Stroke Mother    Hypertension Father    Hypothyroidism Father    Other Maternal Grandfather        myocardial infarction   Other Paternal   Grandfather        myocardial infarction   Colon cancer Niece 26    Social History:  reports that she has never smoked. She has never used smokeless tobacco. She reports that she does not drink alcohol and does not use drugs.  Allergies: No Known Allergies  Medications reviewed.    ROS Full ROS performed and is otherwise negative other than what is stated in HPI   Physical Exam CONSTITUTIONAL: NAD. EYES: Pupils are equal, round, and, Sclera are non-icteric. EARS, NOSE, MOUTH AND THROAT: The oropharynx is clear. The oral mucosa is pink and moist. Hearing is intact to voice. LYMPH NODES:  Lymph nodes in the neck are normal. BREAST : No evidence of any palpable lesions in  either breast.  Nipple and skin is normal.  Bilateral axillas are free of any lymphadenopathy RESPIRATORY:  Lungs are clear. There is normal respiratory effort, with equal breath sounds bilaterally, and without pathologic use of accessory muscles. CARDIOVASCULAR: Heart is regular without murmurs, gallops, or rubs. GI: The abdomen is  soft, nontender, and nondistended. There are no palpable masses. There is no hepatosplenomegaly. There are normal bowel sounds in all quadrants. GU: Rectal deferred.   MUSCULOSKELETAL: Normal muscle strength and tone. No cyanosis or edema.   SKIN: Turgor is good and there are no pathologic skin lesions or ulcers. NEUROLOGIC: Motor and sensation is grossly normal. Cranial nerves are grossly intact. PSYCH:  Oriented to person, place and time. Affect is normal.   Assessment/Plan: 36-year-old female with 2 asymmetries on the right breast one of them consistent with radial scar.  She does have BRIP1 gene.   I do recommend lumpectomy, I do think that we will be able to remove both clips / lesions within the same specimen.  We discussed with her in detail about the procedure the risk the benefits and the possible complications including but not limited to bleeding, infection, deformity and pain. Please note that I spent 40 minutes in the encounter including personally reviewing imaging studies, coordinating her care, placing orders, performing appropriate documentation. Tinaya Ceballos, MD FACS General Surgeon 

## 2022-06-11 NOTE — Anesthesia Preprocedure Evaluation (Signed)
Anesthesia Evaluation  Patient identified by MRN, date of birth, ID band Patient awake    Reviewed: Allergy & Precautions, NPO status , Patient's Chart, lab work & pertinent test results  History of Anesthesia Complications (+) PONV and history of anesthetic complications  Airway Mallampati: I   Neck ROM: Full    Dental no notable dental hx.    Pulmonary neg pulmonary ROS   Pulmonary exam normal breath sounds clear to auscultation       Cardiovascular Exercise Tolerance: Good negative cardio ROS Normal cardiovascular exam Rhythm:Regular Rate:Normal     Neuro/Psych  Headaches PSYCHIATRIC DISORDERS Anxiety        GI/Hepatic negative GI ROS,,,  Endo/Other  Hypothyroidism  Obesity   Renal/GU negative Renal ROS     Musculoskeletal   Abdominal   Peds  Hematology negative hematology ROS (+)   Anesthesia Other Findings   Reproductive/Obstetrics                             Anesthesia Physical Anesthesia Plan  ASA: 2  Anesthesia Plan: General   Post-op Pain Management:    Induction: Intravenous  PONV Risk Score and Plan: 4 or greater and Ondansetron, Dexamethasone, Treatment may vary due to age or medical condition, Scopolamine patch - Pre-op and Propofol infusion  Airway Management Planned: LMA  Additional Equipment:   Intra-op Plan:   Post-operative Plan: Extubation in OR  Informed Consent: I have reviewed the patients History and Physical, chart, labs and discussed the procedure including the risks, benefits and alternatives for the proposed anesthesia with the patient or authorized representative who has indicated his/her understanding and acceptance.     Dental advisory given  Plan Discussed with: CRNA  Anesthesia Plan Comments: (Patient consented for risks of anesthesia including but not limited to:  - adverse reactions to medications - damage to eyes, teeth, lips or  other oral mucosa - nerve damage due to positioning  - sore throat or hoarseness - damage to heart, brain, nerves, lungs, other parts of body or loss of life  Informed patient about role of CRNA in peri- and intra-operative care.  Patient voiced understanding.)       Anesthesia Quick Evaluation

## 2022-06-13 ENCOUNTER — Encounter
Admission: RE | Disposition: A | Discharge status: Home / Self Care | Payer: Self-pay | Source: Home / Self Care | Attending: Surgery

## 2022-06-13 ENCOUNTER — Ambulatory Visit: Payer: BC Managed Care – PPO | Admitting: Anesthesiology

## 2022-06-13 ENCOUNTER — Other Ambulatory Visit: Payer: Self-pay

## 2022-06-13 ENCOUNTER — Ambulatory Visit
Admission: RE | Admit: 2022-06-13 | Discharge: 2022-06-13 | Disposition: A | Discharge status: Home / Self Care | Payer: BC Managed Care – PPO | Attending: Surgery | Admitting: Surgery

## 2022-06-13 ENCOUNTER — Encounter: Payer: Self-pay | Admitting: Surgery

## 2022-06-13 ENCOUNTER — Ambulatory Visit
Admission: RE | Admit: 2022-06-13 | Discharge: 2022-06-13 | Disposition: A | Discharge status: Home / Self Care | Payer: BC Managed Care – PPO | Source: Ambulatory Visit | Attending: Surgery | Admitting: Surgery

## 2022-06-13 DIAGNOSIS — R928 Other abnormal and inconclusive findings on diagnostic imaging of breast: Secondary | ICD-10-CM

## 2022-06-13 DIAGNOSIS — Z01818 Encounter for other preprocedural examination: Secondary | ICD-10-CM

## 2022-06-13 DIAGNOSIS — N6031 Fibrosclerosis of right breast: Secondary | ICD-10-CM | POA: Diagnosis not present

## 2022-06-13 DIAGNOSIS — Z1501 Genetic susceptibility to malignant neoplasm of breast: Secondary | ICD-10-CM | POA: Diagnosis not present

## 2022-06-13 DIAGNOSIS — Z6832 Body mass index (BMI) 32.0-32.9, adult: Secondary | ICD-10-CM | POA: Insufficient documentation

## 2022-06-13 DIAGNOSIS — E039 Hypothyroidism, unspecified: Secondary | ICD-10-CM | POA: Insufficient documentation

## 2022-06-13 DIAGNOSIS — N6489 Other specified disorders of breast: Secondary | ICD-10-CM | POA: Diagnosis not present

## 2022-06-13 DIAGNOSIS — Z800 Family history of malignant neoplasm of digestive organs: Secondary | ICD-10-CM | POA: Insufficient documentation

## 2022-06-13 DIAGNOSIS — Z1509 Genetic susceptibility to other malignant neoplasm: Secondary | ICD-10-CM | POA: Insufficient documentation

## 2022-06-13 DIAGNOSIS — E669 Obesity, unspecified: Secondary | ICD-10-CM | POA: Insufficient documentation

## 2022-06-13 DIAGNOSIS — Z803 Family history of malignant neoplasm of breast: Secondary | ICD-10-CM | POA: Diagnosis not present

## 2022-06-13 DIAGNOSIS — N6081 Other benign mammary dysplasias of right breast: Secondary | ICD-10-CM | POA: Diagnosis not present

## 2022-06-13 DIAGNOSIS — L905 Scar conditions and fibrosis of skin: Secondary | ICD-10-CM | POA: Diagnosis not present

## 2022-06-13 HISTORY — PX: BREAST LUMPECTOMY WITH RADIOFREQUENCY TAG IDENTIFICATION: SHX6884

## 2022-06-13 LAB — POCT PREGNANCY, URINE: Preg Test, Ur: NEGATIVE

## 2022-06-13 SURGERY — BREAST LUMPECTOMY WITH RADIOFREQUENCY TAG IDENTIFICATION
Anesthesia: General | Laterality: Right

## 2022-06-13 MED ORDER — ONDANSETRON HCL 4 MG/2ML IJ SOLN
INTRAMUSCULAR | Status: AC
  Filled 2022-06-13: qty 2

## 2022-06-13 MED ORDER — LIDOCAINE HCL (PF) 2 % IJ SOLN
INTRAMUSCULAR | Status: AC
  Filled 2022-06-13: qty 5

## 2022-06-13 MED ORDER — LACTATED RINGERS IV SOLN: INTRAVENOUS | Status: DC

## 2022-06-13 MED ORDER — STERILE WATER FOR IRRIGATION IR SOLN
Status: DC | PRN
  Administered 2022-06-13: 700 mL

## 2022-06-13 MED ORDER — LIDOCAINE HCL (CARDIAC) PF 100 MG/5ML IV SOSY
PREFILLED_SYRINGE | INTRAVENOUS | Status: DC | PRN
  Administered 2022-06-13: 100 mg via INTRAVENOUS

## 2022-06-13 MED ORDER — CHLORHEXIDINE GLUCONATE CLOTH 2 % EX PADS: 6.0000 | MEDICATED_PAD | Freq: Once | CUTANEOUS | Status: DC

## 2022-06-13 MED ORDER — ONDANSETRON HCL 4 MG/2ML IJ SOLN: 4.0000 mg | Freq: Once | INTRAMUSCULAR | Status: DC | PRN

## 2022-06-13 MED ORDER — CHLORHEXIDINE GLUCONATE 0.12 % MT SOLN
OROMUCOSAL | Status: AC
  Administered 2022-06-13: 15 mL via OROMUCOSAL
  Filled 2022-06-13: qty 15

## 2022-06-13 MED ORDER — KETOROLAC TROMETHAMINE 30 MG/ML IJ SOLN
INTRAMUSCULAR | Status: DC | PRN
  Administered 2022-06-13: 30 mg via INTRAVENOUS

## 2022-06-13 MED ORDER — DEXMEDETOMIDINE HCL IN NACL 80 MCG/20ML IV SOLN
INTRAVENOUS | Status: AC
  Filled 2022-06-13: qty 20

## 2022-06-13 MED ORDER — CHLORHEXIDINE GLUCONATE 0.12 % MT SOLN: 15.0000 mL | Freq: Once | OROMUCOSAL | Status: AC

## 2022-06-13 MED ORDER — FAMOTIDINE 20 MG PO TABS: 20.0000 mg | ORAL_TABLET | Freq: Once | ORAL | Status: AC

## 2022-06-13 MED ORDER — BUPIVACAINE-EPINEPHRINE (PF) 0.25% -1:200000 IJ SOLN
INTRAMUSCULAR | Status: AC
  Filled 2022-06-13: qty 30

## 2022-06-13 MED ORDER — FENTANYL CITRATE (PF) 100 MCG/2ML IJ SOLN
INTRAMUSCULAR | Status: AC
  Filled 2022-06-13: qty 2

## 2022-06-13 MED ORDER — BUPIVACAINE-EPINEPHRINE (PF) 0.25% -1:200000 IJ SOLN
INTRAMUSCULAR | Status: DC | PRN
  Administered 2022-06-13: 40 mL via SURGICAL_CAVITY

## 2022-06-13 MED ORDER — ACETAMINOPHEN 10 MG/ML IV SOLN
INTRAVENOUS | Status: AC
  Filled 2022-06-13: qty 100

## 2022-06-13 MED ORDER — FAMOTIDINE 20 MG PO TABS
ORAL_TABLET | ORAL | Status: AC
  Administered 2022-06-13: 20 mg via ORAL
  Filled 2022-06-13: qty 1

## 2022-06-13 MED ORDER — DEXAMETHASONE SODIUM PHOSPHATE 10 MG/ML IJ SOLN
INTRAMUSCULAR | Status: DC | PRN
  Administered 2022-06-13: 12 mg via INTRAVENOUS

## 2022-06-13 MED ORDER — DEXAMETHASONE SODIUM PHOSPHATE 10 MG/ML IJ SOLN
INTRAMUSCULAR | Status: AC
  Filled 2022-06-13: qty 3

## 2022-06-13 MED ORDER — PROPOFOL 10 MG/ML IV BOLUS
INTRAVENOUS | Status: DC | PRN
  Administered 2022-06-13: 200 mg via INTRAVENOUS
  Administered 2022-06-13: 150 ug/kg/min via INTRAVENOUS

## 2022-06-13 MED ORDER — ONDANSETRON HCL 4 MG/2ML IJ SOLN
INTRAMUSCULAR | Status: DC | PRN
  Administered 2022-06-13: 4 mg via INTRAVENOUS

## 2022-06-13 MED ORDER — FENTANYL CITRATE (PF) 100 MCG/2ML IJ SOLN
INTRAMUSCULAR | Status: DC | PRN
  Administered 2022-06-13: 25 ug via INTRAVENOUS
  Administered 2022-06-13: 75 ug via INTRAVENOUS

## 2022-06-13 MED ORDER — ACETAMINOPHEN 500 MG PO TABS: 1000.0000 mg | ORAL_TABLET | Freq: Four times a day (QID) | ORAL | 0 refills | Status: DC | PRN

## 2022-06-13 MED ORDER — ACETAMINOPHEN 10 MG/ML IV SOLN
INTRAVENOUS | Status: DC | PRN
  Administered 2022-06-13: 1000 mg via INTRAVENOUS

## 2022-06-13 MED ORDER — DEXMEDETOMIDINE HCL IN NACL 80 MCG/20ML IV SOLN
INTRAVENOUS | Status: DC | PRN
  Administered 2022-06-13: 8 ug via BUCCAL
  Administered 2022-06-13: 4 ug via BUCCAL

## 2022-06-13 MED ORDER — OXYCODONE HCL 5 MG PO TABS: 5.0000 mg | ORAL_TABLET | Freq: Once | ORAL | Status: DC | PRN

## 2022-06-13 MED ORDER — ORAL CARE MOUTH RINSE: 15.0000 mL | Freq: Once | OROMUCOSAL | Status: AC

## 2022-06-13 MED ORDER — OXYCODONE HCL 5 MG/5ML PO SOLN: 5.0000 mg | Freq: Once | ORAL | Status: DC | PRN

## 2022-06-13 MED ORDER — CEFAZOLIN SODIUM-DEXTROSE 2-4 GM/100ML-% IV SOLN
INTRAVENOUS | Status: AC
  Filled 2022-06-13: qty 100

## 2022-06-13 MED ORDER — FENTANYL CITRATE (PF) 100 MCG/2ML IJ SOLN: 25.0000 ug | INTRAMUSCULAR | Status: DC | PRN

## 2022-06-13 MED ORDER — PROPOFOL 1000 MG/100ML IV EMUL
INTRAVENOUS | Status: AC
  Filled 2022-06-13: qty 100

## 2022-06-13 MED ORDER — ACETAMINOPHEN 10 MG/ML IV SOLN: 1000.0000 mg | Freq: Once | INTRAVENOUS | Status: DC | PRN

## 2022-06-13 MED ORDER — MIDAZOLAM HCL 2 MG/2ML IJ SOLN
INTRAMUSCULAR | Status: DC | PRN
  Administered 2022-06-13: 1 mg via INTRAVENOUS

## 2022-06-13 MED ORDER — CEFAZOLIN SODIUM-DEXTROSE 2-4 GM/100ML-% IV SOLN
2.0000 g | INTRAVENOUS | Status: AC
  Administered 2022-06-13: 2 g via INTRAVENOUS

## 2022-06-13 MED ORDER — BUPIVACAINE LIPOSOME 1.3 % IJ SUSP
INTRAMUSCULAR | Status: AC
  Filled 2022-06-13: qty 20

## 2022-06-13 MED ORDER — SCOPOLAMINE 1 MG/3DAYS TD PT72
MEDICATED_PATCH | TRANSDERMAL | Status: AC
  Filled 2022-06-13: qty 1

## 2022-06-13 MED ORDER — MIDAZOLAM HCL 2 MG/2ML IJ SOLN
INTRAMUSCULAR | Status: AC
  Filled 2022-06-13: qty 2

## 2022-06-13 SURGICAL SUPPLY — 36 items
ADH SKN CLS APL DERMABOND .7 (GAUZE/BANDAGES/DRESSINGS) ×1
APL PRP FLT STRL LF DISP 70% (MISCELLANEOUS) ×1
APPLICATOR CHLORAPREP 10 TEAL (MISCELLANEOUS) ×1 IMPLANT
APPLIER CLIP 9.375 SM OPEN (CLIP)
APR CLP SM 9.3 20 MLT OPN (CLIP)
CLIP APPLIE 9.375 SM OPEN (CLIP) IMPLANT
DERMABOND ADVANCED .7 DNX12 (GAUZE/BANDAGES/DRESSINGS) ×1 IMPLANT
DEVICE DUBIN SPECIMEN MAMMOGRA (MISCELLANEOUS) ×1 IMPLANT
DRAPE CHEST BREAST 77X106 FENE (MISCELLANEOUS) ×1 IMPLANT
ELECT CAUTERY BLADE 6.4 (BLADE) ×1 IMPLANT
ELECT REM PT RETURN 9FT ADLT (ELECTROSURGICAL) ×1
ELECTRODE REM PT RTRN 9FT ADLT (ELECTROSURGICAL) ×1 IMPLANT
GLOVE BIO SURGEON STRL SZ7 (GLOVE) ×1 IMPLANT
GOWN STRL REUS W/ TWL LRG LVL3 (GOWN DISPOSABLE) ×2 IMPLANT
GOWN STRL REUS W/TWL LRG LVL3 (GOWN DISPOSABLE) ×2
KIT MARKER MARGIN INK (KITS) IMPLANT
KIT TURNOVER KIT A (KITS) ×1 IMPLANT
MANIFOLD NEPTUNE II (INSTRUMENTS) ×1 IMPLANT
MARGIN MAP 10MM (MISCELLANEOUS) IMPLANT
MARKER MARGIN CORRECT CLIP (MARKER) IMPLANT
NEEDLE HYPO 22GX1.5 SAFETY (NEEDLE) ×1 IMPLANT
PACK BASIN MINOR ARMC (MISCELLANEOUS) ×1 IMPLANT
SET LOCALIZER 20 PROBE US (MISCELLANEOUS) ×1 IMPLANT
SPONGE T-LAP 18X18 ~~LOC~~+RFID (SPONGE) ×1 IMPLANT
SUT MNCRL 4-0 (SUTURE) ×2
SUT MNCRL 4-0 27XMFL (SUTURE) ×2
SUT SILK 2 0 SH (SUTURE) IMPLANT
SUT VIC AB 2-0 SH 27 (SUTURE) ×2
SUT VIC AB 2-0 SH 27XBRD (SUTURE) ×2 IMPLANT
SUT VIC AB 3-0 SH 27 (SUTURE) ×2
SUT VIC AB 3-0 SH 27X BRD (SUTURE) ×2 IMPLANT
SUTURE MNCRL 4-0 27XMF (SUTURE) ×2 IMPLANT
TRAP FLUID SMOKE EVACUATOR (MISCELLANEOUS) ×1 IMPLANT
TRAP NEPTUNE SPECIMEN COLLECT (MISCELLANEOUS) ×1 IMPLANT
WATER STERILE IRR 1000ML POUR (IV SOLUTION) ×1 IMPLANT
WATER STERILE IRR 500ML POUR (IV SOLUTION) ×1 IMPLANT

## 2022-06-13 NOTE — Anesthesia Postprocedure Evaluation (Signed)
Anesthesia Post Note  Patient: Tara Osborn  Procedure(s) Performed: BREAST LUMPECTOMY WITH RADIOFREQUENCY TAG IDENTIFICATION, RNFA to assist (Right)  Patient location during evaluation: PACU Anesthesia Type: General Level of consciousness: awake and alert, oriented and patient cooperative Pain management: pain level controlled Vital Signs Assessment: post-procedure vital signs reviewed and stable Respiratory status: spontaneous breathing, nonlabored ventilation and respiratory function stable Cardiovascular status: blood pressure returned to baseline and stable Postop Assessment: adequate PO intake Anesthetic complications: no   No notable events documented.   Last Vitals:  Vitals:   06/13/22 1115 06/13/22 1128  BP: 118/65 133/82  Pulse: 80 82  Resp: 18 16  Temp: (!) 36.2 C 36.7 C  SpO2: 97% 98%    Last Pain:  Vitals:   06/13/22 1128  TempSrc: Temporal  PainSc: 2                  Darrin Nipper

## 2022-06-13 NOTE — Transfer of Care (Signed)
Immediate Anesthesia Transfer of Care Note  Patient: Anelia Carriveau Heinze  Procedure(s) Performed: BREAST LUMPECTOMY WITH RADIOFREQUENCY TAG IDENTIFICATION, RNFA to assist (Right)  Patient Location: PACU  Anesthesia Type:General  Level of Consciousness: drowsy and lethargic  Airway & Oxygen Therapy: Patient Spontanous Breathing and Patient connected to face mask oxygen  Post-op Assessment: Report given to RN, Post -op Vital signs reviewed and stable, Post -op Vital signs reviewed and unstable, Anesthesiologist notified, Patient moving all extremities, and Patient moving all extremities X 4  Post vital signs: Reviewed and stable  Last Vitals:  Vitals Value Taken Time  BP 116/71 06/13/22 1100  Temp 36.2 C 06/13/22 1041  Pulse 89 06/13/22 1104  Resp 15 06/13/22 1104  SpO2 99 % 06/13/22 1104  Vitals shown include unvalidated device data.  Last Pain:  Vitals:   06/13/22 1055  TempSrc:   PainSc: 0-No pain         Complications: No notable events documented.

## 2022-06-13 NOTE — Discharge Instructions (Addendum)
Lumpectomy, Care After The following information offers guidance on how to care for yourself after your procedure. Your health care provider may also give you more specific instructions. If you have problems or questions, contact your health care provider. What can I expect after the procedure? After the procedure, it is common to have: Some pain or redness at the incision site. Breast swelling. Breast tenderness. Stiffness in your arm or shoulder. A change in the shape and feel of your breast. Scar tissue that feels hard to the touch in the area where the lump was removed. Follow these instructions at home: Medicines Take over-the-counter and prescription medicines only as told by your health care provider. If you were prescribed an antibiotic, take it as told by your health care provider. Do not stop taking the antibiotic even if you start to feel better. Ask your health care provider if the medicine prescribed to you: Requires you to avoid driving or using machinery. Can cause constipation. You may need to take these actions to prevent or treat constipation: Drink enough fluid to keep your urine pale yellow. Take over-the-counter or prescription medicines. Eat foods that are high in fiber, such as beans, whole grains, and fresh fruits and vegetables. Limit foods that are high in fat and processed sugars, such as fried or sweet foods. Incision care     Follow instructions from your health care provider about how to take care of your incision. Make sure you: Wash your hands with soap and water for at least 20 seconds before and after you change your bandage (dressing). If soap and water are not available, use hand sanitizer. Change your dressing as told by your health care provider. Leave stitches (sutures), skin glue, or adhesive strips in place. These skin closures may need to stay in place for 2 weeks or longer. If adhesive strip edges start to loosen and curl up, you may trim the  loose edges. Do not remove adhesive strips completely unless your health care provider tells you to do that. Check your incision area every day for signs of infection. Check for: More redness, swelling, or pain. Fluid or blood. Warmth. Pus or a bad smell. Keep your dressing clean and dry. If you were sent home with a surgical drain in place, follow instructions from your health care provider about emptying it. Bathing Do not take baths, swim, or use a hot tub until your health care provider approves. Ask your health care provider if you may take showers. You may only be allowed to take sponge baths. Activity Rest as told by your health care provider. Do not sit for a long time without moving. Get up to take short walks every 1-2 hours. This will improve blood flow and breathing. Ask for help if you feel weak or unsteady. Be careful to avoid any activities that could cause an injury to your arm on the side of your surgery. Do not lift anything that is heavier than 10 lb (4.5 kg), or the limit that you are told, until your health care provider says that it is safe. Avoid lifting with the arm that is on the side of your surgery. Do not carry heavy objects on your shoulder on the side of your surgery. Do exercises to keep your shoulder and arm from getting stiff and swollen. Talk with your health care provider about which exercises are safe for you. Return to your normal activities as told by your health care provider. Ask your health care provider what activities  are safe for you. General instructions Wear a supportive bra as told by your health care provider. Raise (elevate) your arm above the level of your heart while you are sitting or lying down. Do not wear tight jewelry on your arm, wrist, or fingers on the side of your surgery. Wear compression stockings as told by your health care provider. These stockings help to prevent blood clots and reduce swelling in your legs. If you had any lymph  nodes removed during your procedure, be sure to tell all of your health care providers. It is important to share this information before you have certain procedures, such as blood tests or blood pressure measurements. Keep all follow-up visits. You may need to be screened for extra fluid around the lymph nodes and swelling in the breast and arm (lymphedema). Contact a health care provider if: You develop a rash. You have a fever. Your pain worsens or pain medicine is not working. You have swelling, weakness, or numbness in your arm that does not improve after a few weeks. You have new swelling in your breast. You have any of these signs of infection: More redness, swelling, or pain in your incision area. Fluid or blood coming from your incision. Warmth coming from the incision area. Pus or a bad smell coming from your incision. Get help right away if: You have very bad pain in your breast or arm. You have swelling in your legs or arms. You have redness, warmth, or pain in your leg or arm. You have chest pain. You have difficulty breathing. These symptoms may be an emergency. Get help right away. Call 911. Do not wait to see if the symptoms will go away. Do not drive yourself to the hospital. Summary After the procedure, it is common to have breast tenderness, swelling in your breast, and stiffness in your arm and shoulder. Follow instructions from your health care provider about how to take care of your incision. Do not lift anything that is heavier than 10 lb (4.5 kg), or the limit that you are told, until your health care provider says that it is safe. Avoid lifting with the arm that is on the side of your surgery. If you had any lymph nodes removed during your procedure, be sure to tell all of your health care providers. This information is not intended to replace advice given to you by your health care provider. Make sure you discuss any questions you have with your health care  provider. Document Revised: 08/14/2021 Document Reviewed: 08/14/2021 Elsevier Patient Education  2023 [employer]AMBULATORY SURGERY  DISCHARGE INSTRUCTIONS   The drugs that you were given will stay in your system until tomorrow so for the next 24 hours you should not:  Drive an automobile Make any legal decisions Drink any alcoholic beverage   You may resume regular meals tomorrow.  Today it is better to start with liquids and gradually work up to solid foods.  You may eat anything you prefer, but it is better to start with liquids, then soup and crackers, and gradually work up to solid foods.   Please notify your doctor immediately if you have any unusual bleeding, trouble breathing, redness and pain at the surgery site, drainage, fever, or pain not relieved by medication.    Additional Instructions:     Please contact your physician with any problems or Same Day Surgery at [phone], Monday through Friday 6 am to 4 pm, or [place] at [facility] number at [phone].

## 2022-06-13 NOTE — Op Note (Signed)
Pre-operative Diagnosis: Right Radial Breast scar with two clips   Post-operative Diagnosis: Same   Surgeon: Caroleen Hamman,  MD FACS  Anesthesia: GETA  Procedure: Right Partial mastectomy, LOCALIZER guided, Complex closure with tissue rearrangements measuring 64cm2   Findings: Two Clips and LOCALIZER tag within excised specimen   Estimated Blood Loss: Minimal         Drains: None         Specimens: partial mastectomy        Complications: none                 Condition: Stable   Procedure Details  The patient was seen again in the Holding Room. The benefits, complications, treatment options, and expected outcomes were discussed with the patient. The risks of bleeding, infection, recurrence of symptoms, failure to resolve symptoms, hematoma, seroma, open wound, cosmetic deformity, and the need for further surgery were discussed.  The patient was taken to Operating Room, identified  and the procedure verified.  A Time Out was held and the above information confirmed.  Prior to the induction of general anesthesia, antibiotic prophylaxis was administered. VTE prophylaxis was in place. Appropriate anesthesia was then administered and tolerated well.   LOCALIZER WAS USED to locate the targeted area and this was marked. The chest was prepped with Chloraprep and draped in the sterile fashion. The patient was positioned in the supine position.   Attention was turned to the Right breast where a crescent moon incision was made.  Circumferential flaps developed with cautery and a 2-0 vicryl was used to elevate the breast tissue. We confirmed with the localizer that we had good margins circumferentially.Lumpectomy was performed with electrocautery  Hemostasis was with electrocautery. Specimen was removed and marked appropriately; faxitron images obtained. Two clips and RFA tag were in the middle of the resected specimen.,  Tissue advancement flaps were performed to decrease the volume  deficit in the area of the resection.  Please note that the total void area was 8 x 8 cm equals 64 cm.  The chest wall flaps were created by incising the breast parenchyma from the pectoralis fascia in a circumferential method.  The breast parenchyma was then reapproximated in a deep to superficial fashion using interrupted 2-0 Vicryl sutures.  Please note that I placed 2 deep layers of 2-0 Vicryl's. Exparel was used to infiltrate breast tissue and chest wall.  Once assuring that hemostasis was adequate and checked multiple times the wound was closed with 4-0 subcuticular Monocryl sutures. Dermabond was placed  Patient was taken to the recovery room in stable condition.   Caroleen Hamman , MD, FACS

## 2022-06-13 NOTE — Interval H&P Note (Signed)
History and Physical Interval Note:  06/13/2022 8:36 AM  Tara Osborn  has presented today for surgery, with the diagnosis of radial scar breast.  The various methods of treatment have been discussed with the patient and family. After consideration of risks, benefits and other options for treatment, the patient has consented to  Procedure(s): BREAST LUMPECTOMY WITH RADIOFREQUENCY TAG IDENTIFICATION, RNFA to assist (Right) as a surgical intervention.  The patient's history has been reviewed, patient examined, no change in status, stable for surgery.  I have reviewed the patient's chart and labs.  Questions were answered to the patient's satisfaction.     [place]

## 2022-06-13 NOTE — Anesthesia Procedure Notes (Signed)
Procedure Name: LMA Insertion Date/Time: 06/13/2022 9:15 AM  Performed by: Candice Camp, CRNAPre-anesthesia Checklist: Timeout performed, Patient being monitored, Suction available, Emergency Drugs available and Patient identified Patient Re-evaluated:Patient Re-evaluated prior to induction Oxygen Delivery Method: Circle system utilized Preoxygenation: Pre-oxygenation with 100% oxygen Induction Type: IV induction Ventilation: Mask ventilation without difficulty LMA: LMA inserted LMA Size: 3.0 Number of attempts: 1 Tube secured with: Tape

## 2022-06-14 ENCOUNTER — Encounter: Payer: Self-pay | Admitting: Surgery

## 2022-06-14 LAB — SURGICAL PATHOLOGY

## 2022-06-20 NOTE — Progress Notes (Unsigned)
    GYNECOLOGY OFFICE ENCOUNTER NOTE  History:  37 y.o. [id number] here today for today for IUD string check; {IUD Type:19197::"Mirena","Liletta","Kyleena","Paragard"}  IUD was placed  ***. No complaints about the IUD, no concerning side effects.  The following portions of the patient's history were reviewed and updated as appropriate: allergies, current medications, past family history, past medical history, past social history, past surgical history and problem list. Last pap smear on *** was normal, negative HRHPV.  Review of Systems:  Pertinent items are noted in HPI.  Objective:  Physical Exam There were no vitals taken for this visit. CONSTITUTIONAL: Well-developed, well-nourished female in no acute distress.  NEUROLOGIC: Alert and oriented to person, place, and time. Normal reflexes, muscle tone coordination.  ABDOMEN: Soft, no distention noted.   PELVIC: Normal appearing external genitalia; normal appearing vaginal mucosa and cervix.  IUD strings visualized, about *** cm in length outside cervix. Done in the presence of a chaperone.  EXTREMITIES: Non-tender, no edema or cyanosis  Assessment & Plan:  Patient to keep IUD in place for up to *** years; can come in for removal if she desires pregnancy earlier or for any concerning side effects.    Chilton Greathouse, CMA [facility]

## 2022-06-21 ENCOUNTER — Encounter: Payer: Self-pay | Admitting: Obstetrics and Gynecology

## 2022-06-21 ENCOUNTER — Ambulatory Visit: Payer: BC Managed Care – PPO | Admitting: Obstetrics and Gynecology

## 2022-06-21 VITALS — BP 135/89 | HR 88 | Resp 16 | Ht 59.0 in | Wt 151.5 lb

## 2022-06-21 DIAGNOSIS — Z30431 Encounter for routine checking of intrauterine contraceptive device: Secondary | ICD-10-CM | POA: Diagnosis not present

## 2022-06-21 NOTE — Patient Instructions (Signed)
Levonorgestrel Intrauterine Device What is this medication? LEVONORGESTREL (LEE voe nor jes trel) prevents ovulation and pregnancy. It may also be used to treat heavy periods. It belongs to a group of medications called contraceptives. This medication is a progestin hormone. This medicine may be used for other purposes; ask your health care provider or pharmacist if you have questions. COMMON BRAND NAME(S): Minette Headland What should I tell my care team before I take this medication? They need to know if you have any of these conditions: Abnormal Pap smear Cancer of the breast, uterus, or cervix Diabetes Endometritis Genital or pelvic infection now or in the past Have more than one sexual partner or your partner has more than one partner Heart disease History of an ectopic or tubal pregnancy Immune system problems IUD in place Liver disease or tumor Problems with blood clots or take blood-thinners Seizures Use intravenous drugs Uterus of unusual shape Vaginal bleeding that has not been explained An unusual or allergic reaction to levonorgestrel, other hormones, silicone, or polyethylene, medications, foods, dyes, or preservatives Pregnant or trying to get pregnant Breast-feeding How should I use this medication? This device is placed inside the uterus by your care team. A patient package insert for the product will be given each time it is inserted. Be sure to read this information carefully each time. The sheet may change often. Talk to your care team about use of this medication in children. Special care may be needed. Overdosage: If you think you have taken too much of this medicine contact a poison control center or emergency room at once. NOTE: This medicine is only for you. Do not share this medicine with others. What if I miss a dose? This does not apply. Depending on the brand of device you have inserted, the device will need to be replaced every 3 to 8 years  if you wish to continue using this type of birth control. What may interact with this medication? Interactions are not expected. Tell your care team about all the medications you take. This list may not describe all possible interactions. Give your health care provider a list of all the medicines, herbs, non-prescription drugs, or dietary supplements you use. Also tell them if you smoke, drink alcohol, or use illegal drugs. Some items may interact with your medicine. What should I watch for while using this medication? Visit your care team for regular check-ups. Tell your care team if you or your partner becomes HIV positive or gets a sexually transmitted disease. Using this medication does not protect you or your partner against HIV or other sexually transmitted infections (STIs). You can check the placement of the IUD yourself by reaching up to the top of your vagina with clean fingers to feel the threads. Do not pull on the threads. It is a good habit to check placement after each menstrual period. Call your care team right away if you feel more of the IUD than just the threads or if you cannot feel the threads at all. The IUD may come out by itself. You may become pregnant if the device comes out. If you notice that the IUD has come out use a backup birth control method like condoms and call your care team. Using tampons will not change the position of the IUD and are okay to use during your period. This IUD can be safely scanned with magnetic resonance imaging (MRI) only under specific conditions. Before you have an MRI, tell your care team that  you have an IUD in place, and which type of IUD you have in place. What side effects may I notice from receiving this medication? Side effects that you should report to your care team as soon as possible: Allergic reactions--skin rash, itching, hives, swelling of the face, lips, tongue, or throat Blood clot--pain, swelling, or warmth in the leg, shortness  of breath, chest pain Gallbladder problems--severe stomach pain, nausea, vomiting, fever Increase in blood pressure Liver injury--right upper belly pain, loss of appetite, nausea, light-colored stool, dark yellow or brown urine, yellowing skin or eyes, unusual weakness or fatigue New or worsening migraines or headaches Pelvic inflammatory disease (PID)--fever, abdominal pain, pelvic pain, pain or trouble passing urine, spotting, bleeding during or after sex Stroke--sudden numbness or weakness of the face, arm, or leg, trouble speaking, confusion, trouble walking, loss of balance or coordination, dizziness, severe headache, change in vision Unusual vaginal discharge, itching, or odor Vaginal pain, irritation, or sores Worsening mood, feelings of depression Side effects that usually do not require medical attention (report to your care team if they continue or are bothersome): Breast pain or tenderness Dark patches of skin on the face or other sun-exposed areas Irregular menstrual cycles or spotting Nausea Weight gain This list may not describe all possible side effects. Call your doctor for medical advice about side effects. You may report side effects to FDA at 1-800-FDA-1088. Where should I keep my medication? This does not apply. NOTE: This sheet is a summary. It may not cover all possible information. If you have questions about this medicine, talk to your doctor, pharmacist, or health care provider.  2023 Elsevier/Gold Standard (2006-08-22 00:00:00)

## 2022-06-26 ENCOUNTER — Ambulatory Visit (INDEPENDENT_AMBULATORY_CARE_PROVIDER_SITE_OTHER): Payer: BC Managed Care – PPO | Admitting: Surgery

## 2022-06-26 ENCOUNTER — Encounter: Payer: Self-pay | Admitting: Surgery

## 2022-06-26 VITALS — BP 131/86 | HR 81 | Temp 98.7°F | Ht 59.0 in | Wt 149.4 lb

## 2022-06-26 DIAGNOSIS — Z09 Encounter for follow-up examination after completed treatment for conditions other than malignant neoplasm: Secondary | ICD-10-CM

## 2022-06-26 DIAGNOSIS — N6489 Other specified disorders of breast: Secondary | ICD-10-CM

## 2022-06-26 NOTE — Patient Instructions (Addendum)
If you have any concerns or questions, please feel free to call our office. Follow up in 1 year with Mammogram done prior.   Lumpectomy, Care After The following information offers guidance on how to care for yourself after your procedure. Your health care provider may also give you more specific instructions. If you have problems or questions, contact your health care provider. What can I expect after the procedure? After the procedure, it is common to have: Some pain or redness at the incision site. Breast swelling. Breast tenderness. Stiffness in your arm or shoulder. A change in the shape and feel of your breast. Scar tissue that feels hard to the touch in the area where the lump was removed. Follow these instructions at home: Medicines Take over-the-counter and prescription medicines only as told by your health care provider. If you were prescribed an antibiotic, take it as told by your health care provider. Do not stop taking the antibiotic even if you start to feel better. Ask your health care provider if the medicine prescribed to you: Requires you to avoid driving or using machinery. Can cause constipation. You may need to take these actions to prevent or treat constipation: Drink enough fluid to keep your urine pale yellow. Take over-the-counter or prescription medicines. Eat foods that are high in fiber, such as beans, whole grains, and fresh fruits and vegetables. Limit foods that are high in fat and processed sugars, such as fried or sweet foods. Incision care     Follow instructions from your health care provider about how to take care of your incision. Make sure you: Wash your hands with soap and water for at least 20 seconds before and after you change your bandage (dressing). If soap and water are not available, use hand sanitizer. Change your dressing as told by your health care provider. Leave stitches (sutures), skin glue, or adhesive strips in place. These skin  closures may need to stay in place for 2 weeks or longer. If adhesive strip edges start to loosen and curl up, you may trim the loose edges. Do not remove adhesive strips completely unless your health care provider tells you to do that. Check your incision area every day for signs of infection. Check for: More redness, swelling, or pain. Fluid or blood. Warmth. Pus or a bad smell. Keep your dressing clean and dry. If you were sent home with a surgical drain in place, follow instructions from your health care provider about emptying it. Bathing Do not take baths, swim, or use a hot tub until your health care provider approves. Ask your health care provider if you may take showers. You may only be allowed to take sponge baths. Activity Rest as told by your health care provider. Do not sit for a long time without moving. Get up to take short walks every 1-2 hours. This will improve blood flow and breathing. Ask for help if you feel weak or unsteady. Be careful to avoid any activities that could cause an injury to your arm on the side of your surgery. Do not lift anything that is heavier than 10 lb (4.5 kg), or the limit that you are told, until your health care provider says that it is safe. Avoid lifting with the arm that is on the side of your surgery. Do not carry heavy objects on your shoulder on the side of your surgery. Do exercises to keep your shoulder and arm from getting stiff and swollen. Talk with your health care provider about  which exercises are safe for you. Return to your normal activities as told by your health care provider. Ask your health care provider what activities are safe for you. General instructions Wear a supportive bra as told by your health care provider. Raise (elevate) your arm above the level of your heart while you are sitting or lying down. Do not wear tight jewelry on your arm, wrist, or fingers on the side of your surgery. Wear compression stockings as told by  your health care provider. These stockings help to prevent blood clots and reduce swelling in your legs. If you had any lymph nodes removed during your procedure, be sure to tell all of your health care providers. It is important to share this information before you have certain procedures, such as blood tests or blood pressure measurements. Keep all follow-up visits. You may need to be screened for extra fluid around the lymph nodes and swelling in the breast and arm (lymphedema). Contact a health care provider if: You develop a rash. You have a fever. Your pain worsens or pain medicine is not working. You have swelling, weakness, or numbness in your arm that does not improve after a few weeks. You have new swelling in your breast. You have any of these signs of infection: More redness, swelling, or pain in your incision area. Fluid or blood coming from your incision. Warmth coming from the incision area. Pus or a bad smell coming from your incision. Get help right away if: You have very bad pain in your breast or arm. You have swelling in your legs or arms. You have redness, warmth, or pain in your leg or arm. You have chest pain. You have difficulty breathing. These symptoms may be an emergency. Get help right away. Call 911. Do not wait to see if the symptoms will go away. Do not drive yourself to the hospital. Summary After the procedure, it is common to have breast tenderness, swelling in your breast, and stiffness in your arm and shoulder. Follow instructions from your health care provider about how to take care of your incision. Do not lift anything that is heavier than 10 lb (4.5 kg), or the limit that you are told, until your health care provider says that it is safe. Avoid lifting with the arm that is on the side of your surgery. If you had any lymph nodes removed during your procedure, be sure to tell all of your health care providers. This information is not intended to  replace advice given to you by your health care provider. Make sure you discuss any questions you have with your health care provider. Document Revised: 08/14/2021 Document Reviewed: 08/14/2021 Elsevier Patient Education  [place].

## 2022-06-29 ENCOUNTER — Encounter: Payer: Self-pay | Admitting: Surgery

## 2022-06-29 NOTE — Progress Notes (Signed)
Is a 37 year old female 2 weeks out from right lumpectomy for benign disease.  Pathology discussed and reviewed with her in detail there is evidence of radial scar without evidence of malignancy.  She is very happy.  She does have some soreness within the right chest wall.  No fevers no chills.  Her pain is improving.  PE NAD there is no significant breast deformity.  The wound is healing well without evidence of infection no hematomas or complications.  A/P doing very well some mild soreness that likely will improve and is likely postoperative.  No evidence of complications.

## 2022-09-18 ENCOUNTER — Ambulatory Visit: Payer: BC Managed Care – PPO | Admitting: Surgery

## 2022-09-18 ENCOUNTER — Encounter: Payer: Self-pay | Admitting: Surgery

## 2022-09-18 VITALS — BP 129/83 | HR 78 | Temp 98.6°F | Ht 59.0 in | Wt 151.8 lb

## 2022-09-18 DIAGNOSIS — N6489 Other specified disorders of breast: Secondary | ICD-10-CM | POA: Diagnosis not present

## 2022-09-18 DIAGNOSIS — Z09 Encounter for follow-up examination after completed treatment for conditions other than malignant neoplasm: Secondary | ICD-10-CM | POA: Diagnosis not present

## 2022-09-18 NOTE — Patient Instructions (Signed)
If you have any concerns or questions, please feel free to call our office. Follow up as needed.   Breast Self-Awareness Breast self-awareness is knowing how your breasts look and feel. You need to: Check your breasts on a regular basis. Tell your doctor about any changes. Become familiar with the look and feel of your breasts. This can help you catch a breast problem while it is still small and can be treated. You should do breast self-exams even if you have breast implants. What you need: A mirror. A well-lit room. A pillow or other soft object. How to do a breast self-exam Follow these steps to do a breast self-exam: Look for changes  Take off all the clothes above your waist. Stand in front of a mirror in a room with good lighting. Put your hands down at your sides. Compare your breasts in the mirror. Look for any difference between them, such as: A difference in shape. A difference in size. Wrinkles, dips, and bumps in one breast and not the other. Look at each breast for changes in the skin, such as: Redness. Scaly areas. Skin that has gotten thicker. Dimpling. Open sores (ulcers). Look for changes in your nipples, such as: Fluid coming out of a nipple. Fluid around a nipple. Bleeding. Dimpling. Redness. A nipple that looks pushed in (retracted), or that has changed position. Feel for changes Lie on your back. Feel each breast. To do this: Pick a breast to feel. Place a pillow under the shoulder closest to that breast. Put the arm closest to that breast behind your head. Feel the nipple area of that breast using the hand of your other arm. Feel the area with the pads of your three middle fingers by making small circles with your fingers. Use light, medium, and firm pressure. Continue the overlapping circles, moving downward over the breast. Keep making circles with your fingers. Stop when you feel your ribs. Start making circles with your fingers again, this time going  upward until you reach your collarbone. Then, make circles outward across your breast and into your armpit area. Squeeze your nipple. Check for discharge and lumps. Repeat these steps to check your other breast. Sit or stand in the tub or shower. With soapy water on your skin, feel each breast the same way you did when you were lying down. Write down what you find Writing down what you find can help you remember what to tell your doctor. Write down: What is normal for each breast. Any changes you find in each breast. These include: The kind of changes you find. A tender or painful breast. Any lump you find. Write down its size and where it is. When you last had your monthly period (menstrual cycle). General tips If you are breastfeeding, the best time to check your breasts is after you feed your baby or after you use a breast pump. If you get monthly bleeding, the best time to check your breasts is 5-7 days after your monthly cycle ends. With time, you will become comfortable with the self-exam. You will also start to know if there are changes in your breasts. Contact a doctor if: You see a change in the shape or size of your breasts or nipples. You see a change in the skin of your breast or nipples, such as red or scaly skin. You have fluid coming from your nipples that is not normal. You find a new lump or thick area. You have breast pain. You have  any concerns about your breast health. Summary Breast self-awareness includes looking for changes in your breasts and feeling for changes within your breasts. You should do breast self-awareness in front of a mirror in a well-lit room. If you get monthly periods (menstrual cycles), the best time to check your breasts is 5-7 days after your period ends. Tell your doctor about any changes you see in your breasts. Changes include changes in size, changes on the skin, painful or tender breasts, or fluid from your nipples that is not  normal. This information is not intended to replace advice given to you by your health care provider. Make sure you discuss any questions you have with your health care provider. Document Revised: 11/10/2021 Document Reviewed: 04/07/2021 Elsevier Patient Education  [place].

## 2022-09-19 ENCOUNTER — Encounter: Payer: Self-pay | Admitting: Surgery

## 2022-09-19 NOTE — Progress Notes (Signed)
Outpatient Surgical Follow Up  09/19/2022  Tara Osborn is an 37 y.o. female.   Chief Complaint  Patient presents with   Follow-up    Lumpectomy 06/13/22    HPI: Tara Osborn is a 37 y.o. female  0000000 , Monoallelic mutation of BRIP1 gene.    She has a Sister with triple negative breast CA at age 83, she has a niece that had colon CA as well . Same BRIP1 .  She does have family Ashkenazi jew . Hx of lap bilateral salpingectomy 04/2022. She underwent Right lumpectomy with excision of radial scar and two prior biopsy sites.  Final path w/o evidence of malignancy. She reports some mild sorenes on the right chest wall    Past Medical History:  Diagnosis Date   Anxiety    Family history of breast cancer    Genetic testing of female 11/2016   BRIP1 POSITIVE   Headache    High risk of ovarian cancer    Hypothyroidism    Increased risk of breast cancer 12/2016   based on family hx-->IBIS=23.9%, [id number]   Monoallelic mutation of BRIP1 gene 12/2016   [facility] MyRisk; increased ovar cancer risk    Past Surgical History:  Procedure Laterality Date   BREAST BIOPSY Right 05/17/2022   [place] Core Bx,ribbon clip 830 FIBROSIS,   BREAST BIOPSY Right 05/17/2022   [place] Core Bx, coil clip 9:00,FIBROSIS   BREAST BIOPSY Right 05/17/2022   [place] RT BREAST BX W LOC DEV 1ST LESION IMG BX SPEC US GUIDE 05/17/2022 [facility]   BREAST BIOPSY Right 05/17/2022   [place] RT BREAST BX W LOC DEV EA ADD LESION IMG BX SPEC US GUIDE 05/17/2022 [facility]   BREAST BIOPSY Right 06/07/2022   MM RT RADIO FREQUENCY TAG LOC MAMMO GUIDE 06/07/2022 [facility]   BREAST LUMPECTOMY WITH RADIOFREQUENCY TAG IDENTIFICATION Right 06/13/2022   Procedure: BREAST LUMPECTOMY WITH RADIOFREQUENCY TAG IDENTIFICATION, RNFA to assist;  Surgeon: Jules Husbands, MD;  Location: [facility];  Service: General;  Laterality: Right;   FOOT GANGLION EXCISION Left    INTRAUTERINE DEVICE (IUD) INSERTION N/A 05/08/2022    Procedure: INTRAUTERINE DEVICE (IUD) INSERTION;  Surgeon: Rubie Maid, MD;  Location: [facility];  Service: Gynecology;  Laterality: N/A;   IUD REMOVAL N/A 05/08/2022   Procedure: INTRAUTERINE DEVICE (IUD) REMOVAL;  Surgeon: Rubie Maid, MD;  Location: [facility];  Service: Gynecology;  Laterality: N/A;   LAPAROSCOPIC BILATERAL SALPINGECTOMY Bilateral 05/08/2022   Procedure: LAPAROSCOPIC BILATERAL SALPINGECTOMY;  Surgeon: Rubie Maid, MD;  Location: [facility];  Service: Gynecology;  Laterality: Bilateral;   LAPAROSCOPIC OVARIAN CYSTECTOMY Right 05/08/2022   Procedure: RIGHT OVARIAN CYSTECTOMY;  Surgeon: Rubie Maid, MD;  Location: [facility];  Service: Gynecology;  Laterality: Right;    Family History  Problem Relation Age of Onset   Breast cancer Sister 64       triple negative; currently 49   Hypothyroidism Sister    Hypertension Mother    Hypothyroidism Mother    Stroke Mother    Hypertension Father    Hypothyroidism Father    Other Maternal Grandfather        myocardial infarction   Other Paternal Grandfather        myocardial infarction   Colon cancer Niece 60    Social History:  reports that she has never smoked. She has never used smokeless tobacco. She reports that she does not drink alcohol and does not use drugs.  Allergies: No Known Allergies  Medications reviewed.  ROS Full ROS performed and is otherwise negative other than what is stated in HPI   BP 129/83   Pulse 78   Temp 98.6 F (37 C) (Oral)   Ht 4\' 11"  (1.499 m)   Wt 151 lb 12.8 oz (68.9 kg)   SpO2 98%   BMI 30.66 kg/m   Physical Exam  CONSTITUTIONAL: NAD. EYES: Pupils are equal, round, and, Sclera are non-icteric. LYMPH NODES:  Lymph nodes in the neck are normal. Neck w/o megalies or lesions BREAST : No evidence of any palpable lesions in either breast.  Nipple and skin is normal. Right lumpectomy scar with very nice contour and w/o deformities, Left breast larger than right. Bilateral  axillas are free of any lymphadenopathy RESPIRATORY:  Lungs are clear. There is normal respiratory effort, with equal breath sounds bilaterally, and without pathologic use of accessory muscles. CARDIOVASCULAR: Heart is regular without murmurs, gallops, or rubs. GI: The abdomen is  soft, nontender, and nondistended. There are no palpable masses. There is no hepatosplenomegaly. There are normal bowel sounds in all quadrants. GU: Rectal deferred.   MUSCULOSKELETAL: Normal muscle strength and tone. No cyanosis or edema.   SKIN: Turgor is good and there are no pathologic skin lesions or ulcers. NEUROLOGIC: Motor and sensation is grossly normal. Cranial nerves are grossly intact. PSYCH:  Oriented to person, place and time. Affect is normal.   Assessment/Plan: 37 yo s/p right lumpectomy doing very well some mild soreness. No evidence of complications. We will continue yearly exams and mammograms.  I will see her back in 8 months or so. Please note that I spent 20 minutes in the encounter including  coordinating her care, placing orders, performing appropriate documentation.   Caroleen Hamman, MD [facility] General Surgeon

## 2022-11-07 DIAGNOSIS — L03115 Cellulitis of right lower limb: Secondary | ICD-10-CM | POA: Diagnosis not present

## 2022-11-07 DIAGNOSIS — W57XXXA Bitten or stung by nonvenomous insect and other nonvenomous arthropods, initial encounter: Secondary | ICD-10-CM | POA: Diagnosis not present

## 2022-12-27 ENCOUNTER — Ambulatory Visit: Payer: BC Managed Care – PPO | Admitting: Surgery

## 2022-12-27 ENCOUNTER — Encounter: Payer: Self-pay | Admitting: Surgery

## 2022-12-27 VITALS — BP 154/94 | HR 79 | Temp 98.4°F | Ht 59.0 in | Wt 162.6 lb

## 2022-12-27 DIAGNOSIS — N6310 Unspecified lump in the right breast, unspecified quadrant: Secondary | ICD-10-CM | POA: Diagnosis not present

## 2022-12-27 DIAGNOSIS — N6320 Unspecified lump in the left breast, unspecified quadrant: Secondary | ICD-10-CM

## 2022-12-27 NOTE — Patient Instructions (Signed)
Breast Self-Awareness Breast self-awareness is knowing how your breasts look and feel. You need to: Check your breasts on a regular basis. Tell your doctor about any changes. Become familiar with the look and feel of your breasts. This can help you catch a breast problem while it is still small and can be treated. You should do breast self-exams even if you have breast implants. What you need: A mirror. A well-lit room. A pillow or other soft object. How to do a breast self-exam Follow these steps to do a breast self-exam: Look for changes  Take off all the clothes above your waist. Stand in front of a mirror in a room with good lighting. Put your hands down at your sides. Compare your breasts in the mirror. Look for any difference between them, such as: A difference in shape. A difference in size. Wrinkles, dips, and bumps in one breast and not the other. Look at each breast for changes in the skin, such as: Redness. Scaly areas. Skin that has gotten thicker. Dimpling. Open sores (ulcers). Look for changes in your nipples, such as: Fluid coming out of a nipple. Fluid around a nipple. Bleeding. Dimpling. Redness. A nipple that looks pushed in (retracted), or that has changed position. Feel for changes Lie on your back. Feel each breast. To do this: Pick a breast to feel. Place a pillow under the shoulder closest to that breast. Put the arm closest to that breast behind your head. Feel the nipple area of that breast using the hand of your other arm. Feel the area with the pads of your three middle fingers by making small circles with your fingers. Use light, medium, and firm pressure. Continue the overlapping circles, moving downward over the breast. Keep making circles with your fingers. Stop when you feel your ribs. Start making circles with your fingers again, this time going upward until you reach your collarbone. Then, make circles outward across your breast and into your  armpit area. Squeeze your nipple. Check for discharge and lumps. Repeat these steps to check your other breast. Sit or stand in the tub or shower. With soapy water on your skin, feel each breast the same way you did when you were lying down. Write down what you find Writing down what you find can help you remember what to tell your doctor. Write down: What is normal for each breast. Any changes you find in each breast. These include: The kind of changes you find. A tender or painful breast. Any lump you find. Write down its size and where it is. When you last had your monthly period (menstrual cycle). General tips If you are breastfeeding, the best time to check your breasts is after you feed your baby or after you use a breast pump. If you get monthly bleeding, the best time to check your breasts is 5-7 days after your monthly cycle ends. With time, you will become comfortable with the self-exam. You will also start to know if there are changes in your breasts. Contact a doctor if: You see a change in the shape or size of your breasts or nipples. You see a change in the skin of your breast or nipples, such as red or scaly skin. You have fluid coming from your nipples that is not normal. You find a new lump or thick area. You have breast pain. You have any concerns about your breast health. Summary Breast self-awareness includes looking for changes in your breasts and feeling for changes   within your breasts. You should do breast self-awareness in front of a mirror in a well-lit room. If you get monthly periods (menstrual cycles), the best time to check your breasts is 5-7 days after your period ends. Tell your doctor about any changes you see in your breasts. Changes include changes in size, changes on the skin, painful or tender breasts, or fluid from your nipples that is not normal. This information is not intended to replace advice given to you by your health care provider. Make sure  you discuss any questions you have with your health care provider. Document Revised: 11/10/2021 Document Reviewed: 04/07/2021 Elsevier Patient Education  2024 Elsevier Inc.  

## 2022-12-29 NOTE — Progress Notes (Signed)
Outpatient Surgical Follow Up  12/29/2022  Tara Osborn is an 37 y.o. female.   Chief Complaint  Patient presents with   Follow-up    Right breast lumpectomy 06/13/22    HPI:  Tara Osborn is a 37 y.o. 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    She has a Sister with triple negative breast CA at age 54, she has a niece that had colon CA as well . Same BRIP1 .  She does have family Ashkenazi jew . Hx of lap bilateral salpingectomy 04/2022. She did have a lumpectomy by me 6-1/2 months ago for radial scar.  Pathology showed completely resected radial scar, no evidence of malignancy, chronic fibrocystic changes. Comes now with some lumps on the right breast with some intermittent pain that is dull, mild and seems to be worsening with breast and pressure.   Past Medical History:  Diagnosis Date   Anxiety    Family history of breast cancer    Genetic testing of female 11/2016   BRIP1 POSITIVE   Headache    High risk of ovarian cancer    Hypothyroidism    Increased risk of breast cancer 12/2016   based on family hx-->IBIS=23.9%, riskscore=20.4%   Monoallelic mutation of BRIP1 gene 12/2016   [facility] MyRisk; increased ovar cancer risk    Past Surgical History:  Procedure Laterality Date   BREAST BIOPSY Right 05/17/2022   [place] Core Bx,ribbon clip 830 FIBROSIS,   BREAST BIOPSY Right 05/17/2022   [place] Core Bx, coil clip 9:00,FIBROSIS   BREAST BIOPSY Right 05/17/2022   [place] RT BREAST BX W LOC DEV 1ST LESION IMG BX SPEC US GUIDE 05/17/2022 [facility]   BREAST BIOPSY Right 05/17/2022   [place] RT BREAST BX W LOC DEV EA ADD LESION IMG BX SPEC US GUIDE 05/17/2022 [facility]   BREAST BIOPSY Right 06/07/2022   MM RT RADIO FREQUENCY TAG LOC MAMMO GUIDE 06/07/2022 [facility]   BREAST LUMPECTOMY WITH RADIOFREQUENCY TAG IDENTIFICATION Right 06/13/2022   Procedure: BREAST LUMPECTOMY WITH RADIOFREQUENCY TAG IDENTIFICATION, RNFA to assist;  Surgeon: Leafy Ro, MD;   Location: [facility];  Service: General;  Laterality: Right;   FOOT GANGLION EXCISION Left    INTRAUTERINE DEVICE (IUD) INSERTION N/A 05/08/2022   Procedure: INTRAUTERINE DEVICE (IUD) INSERTION;  Surgeon: Hildred Laser, MD;  Location: [facility];  Service: Gynecology;  Laterality: N/A;   IUD REMOVAL N/A 05/08/2022   Procedure: INTRAUTERINE DEVICE (IUD) REMOVAL;  Surgeon: Hildred Laser, MD;  Location: [facility];  Service: Gynecology;  Laterality: N/A;   LAPAROSCOPIC BILATERAL SALPINGECTOMY Bilateral 05/08/2022   Procedure: LAPAROSCOPIC BILATERAL SALPINGECTOMY;  Surgeon: Hildred Laser, MD;  Location: [facility];  Service: Gynecology;  Laterality: Bilateral;   LAPAROSCOPIC OVARIAN CYSTECTOMY Right 05/08/2022   Procedure: RIGHT OVARIAN CYSTECTOMY;  Surgeon: Hildred Laser, MD;  Location: [facility];  Service: Gynecology;  Laterality: Right;    Family History  Problem Relation Age of Onset   Breast cancer Sister 24       triple negative; currently 53   Hypothyroidism Sister    Hypertension Mother    Hypothyroidism Mother    Stroke Mother    Hypertension Father    Hypothyroidism Father    Other Maternal Grandfather        myocardial infarction   Other Paternal Grandfather        myocardial infarction   Colon cancer Niece 41    Social History:  reports that she has never smoked. She has never used  smokeless tobacco. She reports that she does not drink alcohol and does not use drugs.  Allergies: No Known Allergies  Medications reviewed.    ROS Full ROS performed and is otherwise negative other than what is stated in HPI   BP (!) 154/94   Pulse 79   Temp 98.4 F (36.9 C) (Oral)   Ht 4\' 11"  (1.499 m)   Wt 162 lb 9.6 oz (73.8 kg)   SpO2 97%   BMI 32.84 kg/m   Physical Exam CONSTITUTIONAL: NAD. EYES: Pupils are equal, round, and, Sclera are non-icteric. LYMPH NODES:  Lymph nodes in the neck are normal. Neck w/o megalies or lesions BREAST : No evidence of any palpable lesions in  either breast. Some fibrocystic changes on both breast, this seems more pronounced on the right breast, although I do not feel a discrete worrisome mass,  Nipple and skin is normal. Right lumpectomy scar with very nice contour and w/o deformities, Left breast larger than right. Bilateral axillas are free of any lymphadenopathy RESPIRATORY:  Lungs are clear. There is normal respiratory effort, with equal breath sounds bilaterally, and without pathologic use of accessory muscles. CARDIOVASCULAR: Heart is regular without murmurs, gallops, or rubs. GI: The abdomen is  soft, nontender, and nondistended. There are no palpable masses. There is no hepatosplenomegaly. There are normal bowel sounds in all quadrants. GU: Rectal deferred.   MUSCULOSKELETAL: Normal muscle strength and tone. No cyanosis or edema.   SKIN: Turgor is good and there are no pathologic skin lesions or ulcers. NEUROLOGIC: Motor and sensation is grossly normal. Cranial nerves are grossly intact. PSYCH:  Oriented to person, place and time. Affect is normal.     Assessment/Plan:\ 37 yo s/p right lumpectomy . No evidence of complications.  There are some fibrocystic changes and some fullness of the right breast.  Given the strong family history I do recommend further imaging studies.  From a surgical perspective I am not that concerned about a malignancy but I want to be on the safe side Please note that I spent 30 minutes in the encounter including  coordinating her care, placing orders, performing appropriate documentation.   Sterling Big, MD [facility]  General Surgeon

## 2023-01-03 ENCOUNTER — Ambulatory Visit
Admission: RE | Admit: 2023-01-03 | Discharge: 2023-01-03 | Disposition: A | Discharge status: Home / Self Care | Payer: BC Managed Care – PPO | Source: Ambulatory Visit | Attending: Surgery | Admitting: Surgery

## 2023-01-03 DIAGNOSIS — N6310 Unspecified lump in the right breast, unspecified quadrant: Secondary | ICD-10-CM | POA: Diagnosis not present

## 2023-01-03 DIAGNOSIS — N6320 Unspecified lump in the left breast, unspecified quadrant: Secondary | ICD-10-CM | POA: Diagnosis not present

## 2023-01-03 DIAGNOSIS — R9230 Dense breasts, unspecified: Secondary | ICD-10-CM | POA: Diagnosis not present

## 2023-01-03 DIAGNOSIS — Z803 Family history of malignant neoplasm of breast: Secondary | ICD-10-CM | POA: Diagnosis not present

## 2023-01-03 MED ORDER — GADOBUTROL 1 MMOL/ML IV SOLN
7.0000 mL | Freq: Once | INTRAVENOUS | Status: AC | PRN
  Administered 2023-01-03: 7 mL via INTRAVENOUS

## 2023-01-08 ENCOUNTER — Ambulatory Visit: Payer: Self-pay | Admitting: Physician Assistant

## 2023-01-08 ENCOUNTER — Other Ambulatory Visit: Payer: Self-pay

## 2023-01-08 DIAGNOSIS — N6310 Unspecified lump in the right breast, unspecified quadrant: Secondary | ICD-10-CM

## 2023-01-08 DIAGNOSIS — N6489 Other specified disorders of breast: Secondary | ICD-10-CM

## 2023-01-12 ENCOUNTER — Ambulatory Visit
Admission: RE | Admit: 2023-01-12 | Discharge: 2023-01-12 | Disposition: A | Discharge status: Home / Self Care | Payer: BC Managed Care – PPO | Source: Ambulatory Visit | Attending: Surgery | Admitting: Surgery

## 2023-01-12 DIAGNOSIS — N6310 Unspecified lump in the right breast, unspecified quadrant: Secondary | ICD-10-CM | POA: Diagnosis not present

## 2023-01-12 DIAGNOSIS — R92333 Mammographic heterogeneous density, bilateral breasts: Secondary | ICD-10-CM | POA: Diagnosis not present

## 2023-01-12 DIAGNOSIS — Z803 Family history of malignant neoplasm of breast: Secondary | ICD-10-CM | POA: Diagnosis not present

## 2023-01-12 DIAGNOSIS — N6311 Unspecified lump in the right breast, upper outer quadrant: Secondary | ICD-10-CM | POA: Diagnosis not present

## 2023-02-03 IMAGING — MG MM DIGITAL SCREENING BILAT W/ TOMO AND CAD
8 series · 8 of 24 positions shown · non-contrast
Comparison: Previous exam(s).

CLINICAL DATA: Screening.

EXAM:
DIGITAL SCREENING BILATERAL MAMMOGRAM WITH TOMOSYNTHESIS AND CAD
TECHNIQUE: Bilateral screening digital craniocaudal and mediolateral oblique
mammograms were obtained. Bilateral screening digital breast
tomosynthesis was performed. The images were evaluated with
computer-aided detection.

[L CC synth-2D]
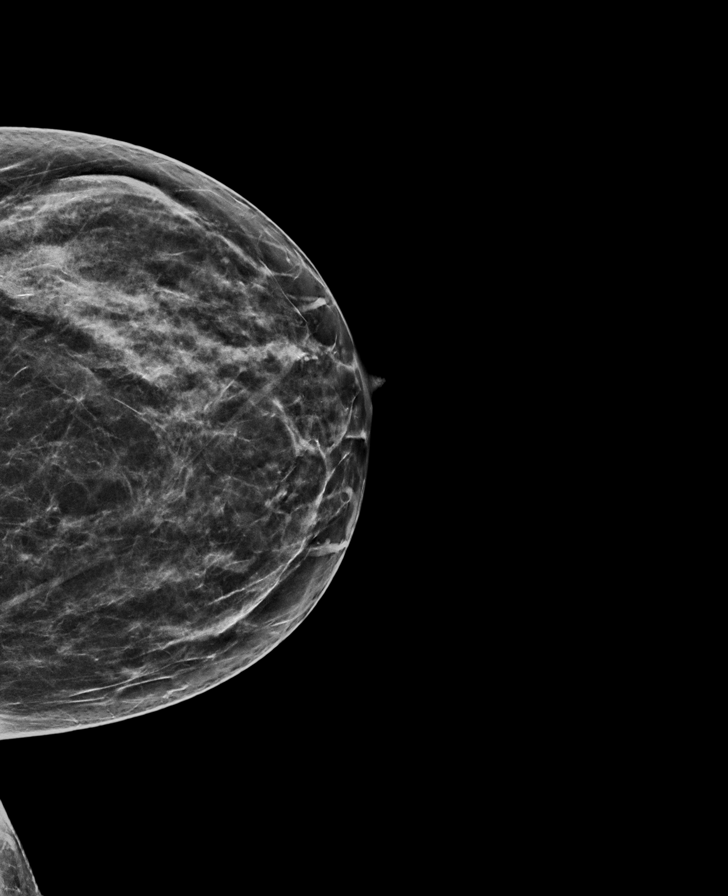

[R CC synth-2D]
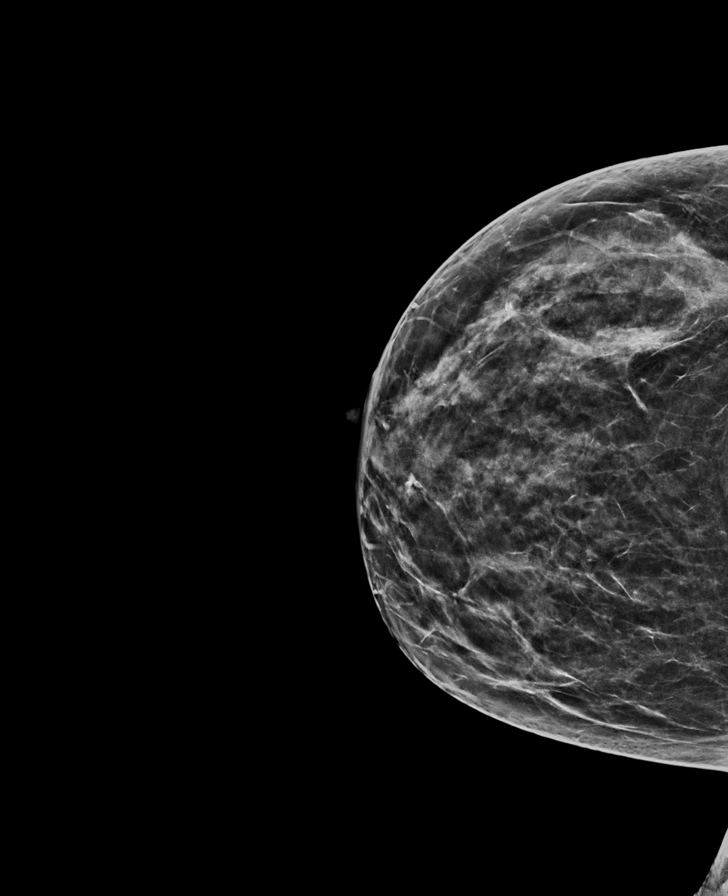

[L MLO synth-2D]
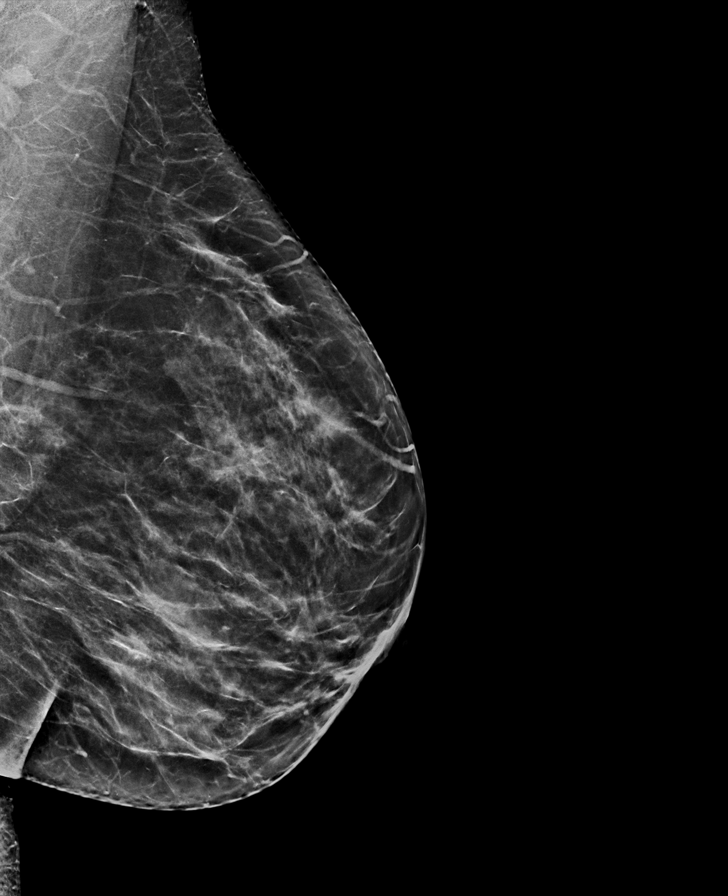

[R MLO synth-2D]
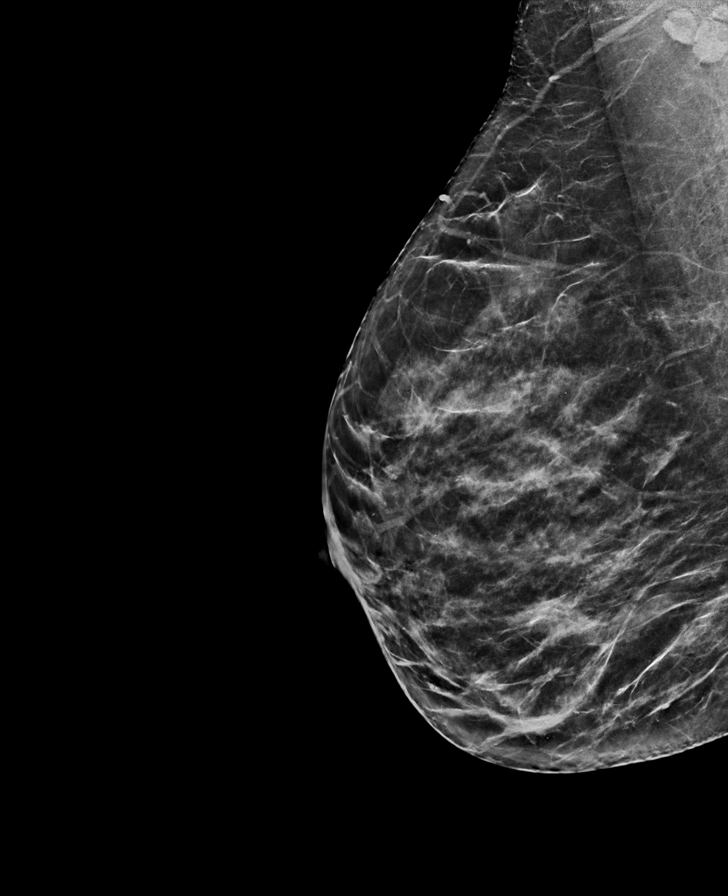

[L CC tomo · tomo slice 32/63.0]
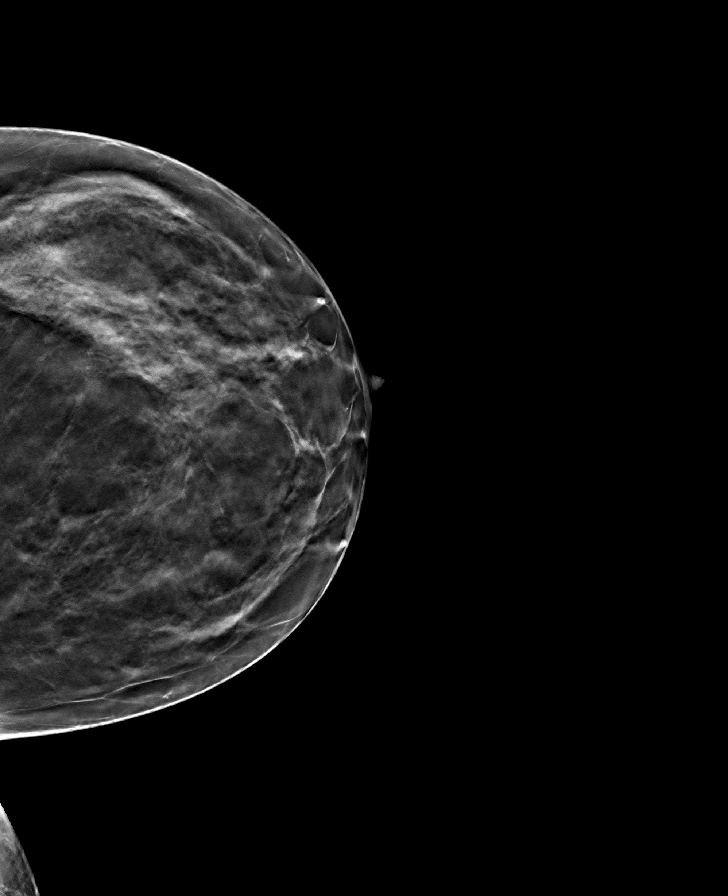

[L MLO tomo · tomo slice 34/67.0]
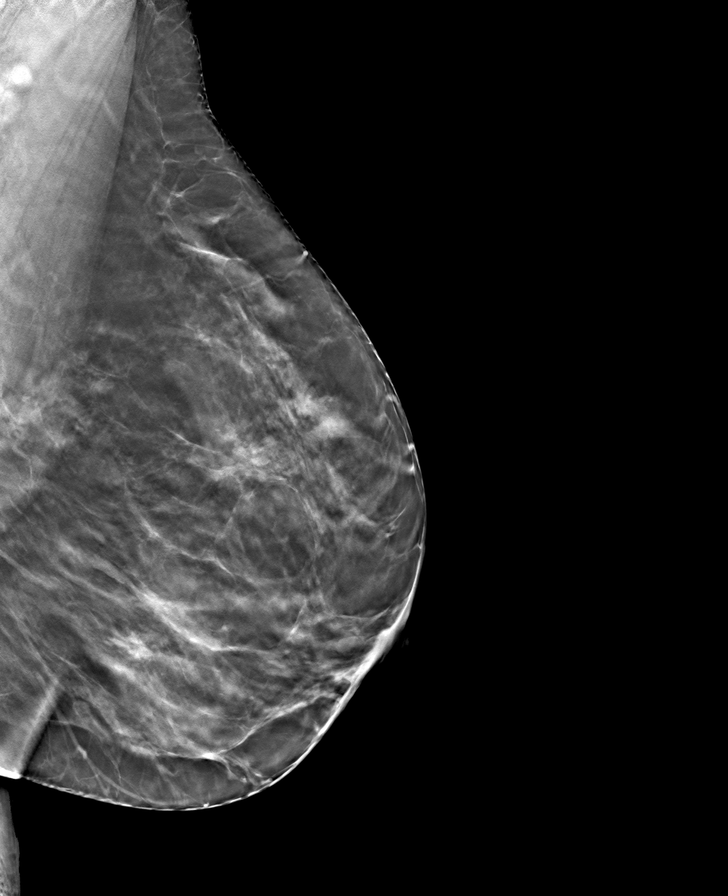

[R CC tomo · tomo slice 33/65.0]
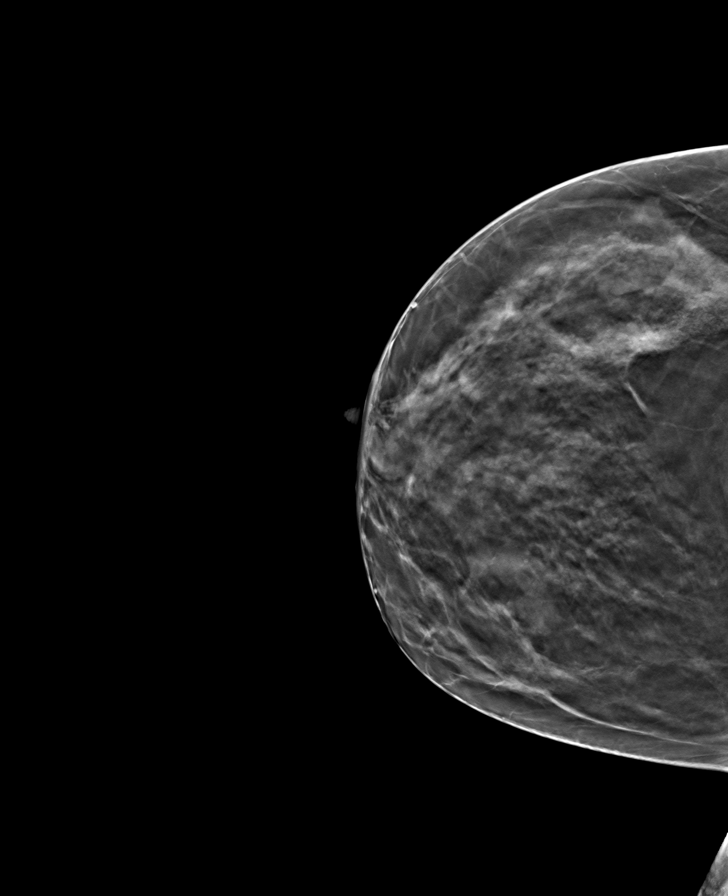

[R MLO tomo · tomo slice 33/64.0]
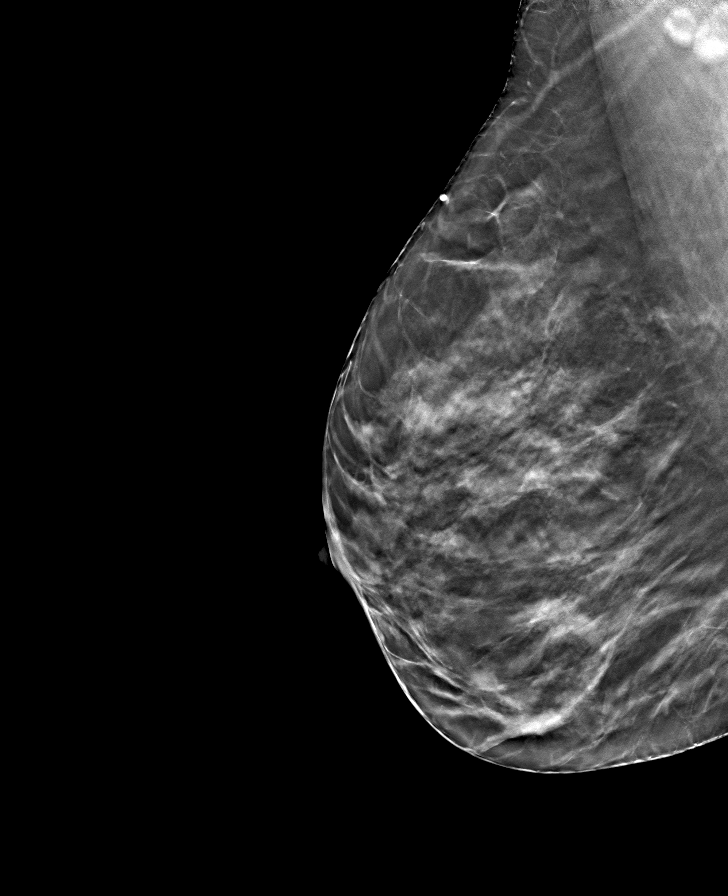

[8 of 24 positions shown; findings below may reference images not displayed]

ACR Breast Density Category c: The breast tissue is heterogeneously
dense, which may obscure small masses.
FINDINGS: There are no findings suspicious for malignancy.
IMPRESSION: No mammographic evidence of malignancy. A result letter of this
screening mammogram will be mailed directly to the patient.

RECOMMENDATION:
Screening mammogram at age 38. (Code:P4-T-W4U)

BI-RADS CATEGORY  1: Negative.

## 2023-02-05 ENCOUNTER — Other Ambulatory Visit: Payer: Self-pay | Admitting: Surgery

## 2023-02-05 DIAGNOSIS — Z1231 Encounter for screening mammogram for malignant neoplasm of breast: Secondary | ICD-10-CM

## 2023-03-21 NOTE — Progress Notes (Unsigned)
PCP:  Patient, No Pcp Per   No chief complaint on file.    HPI:      [name] is a 37 y.o. [id number] whose LMP was No LMP recorded. (Menstrual status: IUD)., presents today for her annual examination.  Her menses are absent with Mirena IUD, has occas scant spotting, no dysmen.   Sex activity: single partner, contraception - BS due to BRIP1 mutation. Last Pap: 03/09/22 Results were: no abnormalities /neg HPV DNA. No abn paps.   Last mammogram: 03/09/22  Results were: normal--routine follow-up in 12 months There is a FH of breast cancer in her sister. Pt is BRCA neg but BRIP1 heterozygous positive (increased risk of ovarian cancer; recommendation for BSO age 58-50), done on [facility] 2018. IBIS=23.9%, riskscore=20.4%. There is no FH of ovarian cancer. The patient does not do self-breast exams.   Tobacco use: The patient denies current or previous tobacco use. Alcohol use: none No drug use Exercise: not active  She does get adequate calcium and some Vitamin D in her diet.  Pt with hypothyroidism, on levo 75 mcg daily. Due for labs.  Pt with GAD, takes sertraline 150 mg daily with sx control   Pt having migraines with and without aura now; sx seem monthly but pt with IUD so unsure if menstrual.  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/13/2022   Increased risk of breast cancer 03/08/2022   Galactorrhea 07/15/2019   Acquired hypothyroidism 07/15/2019   Generalized anxiety disorder 12/05/2018   Family history of breast cancer    Monoallelic mutation of BRIP1 gene 04/02/2017    Past Surgical History:  Procedure Laterality Date   BREAST BIOPSY Right 05/17/2022   [place] Core Bx,ribbon clip 830 FIBROSIS,   BREAST BIOPSY Right 05/17/2022   [place] Core Bx, coil clip 9:00,FIBROSIS   BREAST BIOPSY Right 05/17/2022   [place] RT BREAST BX W LOC DEV 1ST LESION IMG BX SPEC US GUIDE 05/17/2022 [facility]   BREAST BIOPSY Right 05/17/2022   [place] RT  BREAST BX W LOC DEV EA ADD LESION IMG BX SPEC US GUIDE 05/17/2022 [facility]   BREAST BIOPSY Right 06/07/2022   MM RT RADIO FREQUENCY TAG LOC MAMMO GUIDE 06/07/2022 [facility]   BREAST LUMPECTOMY WITH RADIOFREQUENCY TAG IDENTIFICATION Right 06/13/2022   Procedure: BREAST LUMPECTOMY WITH RADIOFREQUENCY TAG IDENTIFICATION, RNFA to assist;  Surgeon: Leafy Ro, MD;  Location: [facility];  Service: General;  Laterality: Right;   FOOT GANGLION EXCISION Left    INTRAUTERINE DEVICE (IUD) INSERTION N/A 05/08/2022   Procedure: INTRAUTERINE DEVICE (IUD) INSERTION;  Surgeon: Hildred Laser, MD;  Location: [facility];  Service: Gynecology;  Laterality: N/A;   IUD REMOVAL N/A 05/08/2022   Procedure: INTRAUTERINE DEVICE (IUD) REMOVAL;  Surgeon: Hildred Laser, MD;  Location: [facility];  Service: Gynecology;  Laterality: N/A;   LAPAROSCOPIC BILATERAL SALPINGECTOMY Bilateral 05/08/2022   Procedure: LAPAROSCOPIC BILATERAL SALPINGECTOMY;  Surgeon: Hildred Laser, MD;  Location: [facility];  Service: Gynecology;  Laterality: Bilateral;   LAPAROSCOPIC OVARIAN CYSTECTOMY Right 05/08/2022   Procedure: RIGHT OVARIAN CYSTECTOMY;  Surgeon: Hildred Laser, MD;  Location: [facility];  Service: Gynecology;  Laterality: Right;    Family History  Problem Relation Age of Onset   Breast cancer Sister 62       triple negative; currently 66   Hypothyroidism Sister    Hypertension Mother    Hypothyroidism Mother    Stroke Mother    Hypertension Father  Hypothyroidism Father    Other Maternal Grandfather        myocardial infarction   Other Paternal Grandfather        myocardial infarction   Colon cancer Niece 63    Social History   Socioeconomic History   Marital status: Married    Spouse name: Charles   Number of children: 2   Years of education: Not on file   Highest education level: Not on file  Occupational History   Not on file  Tobacco Use   Smoking status: Never   Smokeless tobacco: Never   Vaping Use   Vaping status: Never Used  Substance and Sexual Activity   Alcohol use: No   Drug use: No   Sexual activity: Yes    Birth control/protection: I.U.D.    Comment: Mirena  Other Topics Concern   Not on file  Social History Narrative   Not on file   Social Determinants of Health   Financial Resource Strain: Not on file  Food Insecurity: Not on file  Transportation Needs: Not on file  Physical Activity: Insufficiently Active (09/06/2017)   Exercise Vital Sign    Days of Exercise per Week: 5 days    Minutes of Exercise per Session: 20 min  Stress: Stress Concern Present (09/06/2017)   [employer] of Occupational Health - Occupational Stress Questionnaire    Feeling of Stress : To some extent  Social Connections: Somewhat Isolated (09/06/2017)   Social Connection and Isolation Panel [NHANES]    Frequency of Communication with Friends and Family: More than three times a week    Frequency of Social Gatherings with Friends and Family: Once a week    Attends Religious Services: Never    Database administrator or Organizations: No    Attends Banker Meetings: Never    Marital Status: Married  Catering manager Violence: Not At Risk (09/06/2017)   Humiliation, Afraid, Rape, and Kick questionnaire    Fear of Current or Ex-Partner: No    Emotionally Abused: No    Physically Abused: No    Sexually Abused: No     Current Outpatient Medications:    acetaminophen (TYLENOL) 500 MG tablet, Take 2 tablets (1,000 mg total) by mouth every 6 (six) hours as needed for mild pain., Disp: 30 tablet, Rfl: 0   cetirizine (ZYRTEC) 10 MG tablet, Take 10 mg by mouth daily., Disp: , Rfl:    ibuprofen (ADVIL) 600 MG tablet, Take 1 tablet (600 mg total) by mouth every 6 (six) hours as needed., Disp: 60 tablet, Rfl: 0   levonorgestrel (MIRENA) 20 MCG/24HR IUD, 1 each by Intrauterine route once., Disp: , Rfl:    levothyroxine (SYNTHROID) 75 MCG tablet, Take 1 tablet (75 mcg  total) by mouth daily., Disp: 90 tablet, Rfl: 3   sertraline (ZOLOFT) 100 MG tablet, TAKE 1.5 TABLETS (150MG  TOTAL) BY MOUTH DAILY, Disp: 135 tablet, Rfl: 3     ROS:  Review of Systems  Constitutional:  Negative for fatigue, fever and unexpected weight change.  Respiratory:  Negative for cough, shortness of breath and wheezing.   Cardiovascular:  Negative for chest pain, palpitations and leg swelling.  Gastrointestinal:  Negative for blood in stool, constipation, diarrhea, nausea and vomiting.  Endocrine: Negative for cold intolerance, heat intolerance and polyuria.  Genitourinary:  Negative for dyspareunia, dysuria, flank pain, frequency, genital sores, hematuria, menstrual problem, pelvic pain, urgency, vaginal bleeding, vaginal discharge and vaginal pain.  Musculoskeletal:  Negative for back pain, joint  swelling and myalgias.  Skin:  Negative for rash.  Neurological:  Positive for headaches. Negative for dizziness, syncope, light-headedness and numbness.  Hematological:  Negative for adenopathy.  Psychiatric/Behavioral:  Positive for agitation. Negative for confusion, sleep disturbance and suicidal ideas. The patient is not nervous/anxious.    BREAST: No symptoms   Objective: There were no vitals taken for this visit.   Physical Exam Constitutional:      Appearance: She is well-developed.  Genitourinary:     Vulva normal.     Right Labia: No rash, tenderness or lesions.    Left Labia: No tenderness, lesions or rash.    No vaginal discharge, erythema or tenderness.      Right Adnexa: not tender and no mass present.    Left Adnexa: not tender and no mass present.    No cervical friability or polyp.     IUD strings visualized.     Uterus is not enlarged or tender.  Breasts:    Right: No mass, nipple discharge, skin change or tenderness.     Left: No mass, nipple discharge, skin change or tenderness.  Neck:     Thyroid: No thyromegaly.  Cardiovascular:     Rate and  Rhythm: Normal rate and regular rhythm.     Heart sounds: Normal heart sounds. No murmur heard. Pulmonary:     Effort: Pulmonary effort is normal.     Breath sounds: Normal breath sounds.  Abdominal:     Palpations: Abdomen is soft.     Tenderness: There is no abdominal tenderness. There is no guarding or rebound.  Musculoskeletal:        General: Normal range of motion.     Cervical back: Normal range of motion.  Lymphadenopathy:     Cervical: No cervical adenopathy.  Neurological:     General: No focal deficit present.     Mental Status: She is alert and oriented to person, place, and time.     Cranial Nerves: No cranial nerve deficit.  Skin:    General: Skin is warm and dry.  Psychiatric:        Mood and Affect: Mood normal.        Behavior: Behavior normal.        Thought Content: Thought content normal.        Judgment: Judgment normal.  Vitals reviewed.    Assessment/Plan: Encounter for annual routine gynecological examination  Cervical cancer screening - Plan: Cytology - PAP  Screening for HPV (human papillomavirus) - Plan: Cytology - PAP  Encounter for routine checking of intrauterine contraceptive device (IUD)--IUD strings in cx os, due for rem 11/24. Pt to consider replacement vs BTL.   Encounter for screening mammogram for malignant neoplasm of breast - Plan: MM 3D SCREEN BREAST BILATERAL; pt to schedule mammo  Family history of breast cancer - Plan: MM 3D SCREEN BREAST BILATERAL; Pt is BRCA/breast gene panel neg.   Increased risk of breast cancer - Plan: MM 3D SCREEN BREAST BILATERAL; pt aware of recommendations of monthly SBE, yearly CBE and mammos, as well as scr MRI. Schedule mammo, f/u for MRI ref prn. Increase Vit D supp  Monoallelic mutation of BRIP1 gene - Plan: MM 3D SCREEN BREAST BILATERAL; discussed BSO age 90-50. Pt too young now to do BSO so can consider BS for contraception now.   Acquired hypothyroidism - Plan: TSH, T4, free; check labs. Will RF  meds based on results  Generalized anxiety disorder - Plan: sertraline (ZOLOFT) 100 MG tablet;  doing well. Rx RF sertraline.   Migraine with and without aura--keep diary, f/u with PCP. Neck stretching/Mg 500 mg supp.   No orders of the defined types were placed in this encounter.            GYN counsel breast self exam, mammography screening, adequate intake of calcium and vitamin D, diet and exercise     F/U  No follow-ups on file.  Luka Stohr B. Josphine Laffey, PA-C 03/21/2023 2:47 PM

## 2023-03-22 ENCOUNTER — Ambulatory Visit (INDEPENDENT_AMBULATORY_CARE_PROVIDER_SITE_OTHER): Payer: BC Managed Care – PPO | Admitting: Obstetrics and Gynecology

## 2023-03-22 ENCOUNTER — Encounter: Payer: Self-pay | Admitting: Obstetrics and Gynecology

## 2023-03-22 VITALS — BP 110/70 | Ht 59.0 in | Wt 161.0 lb

## 2023-03-22 DIAGNOSIS — R1031 Right lower quadrant pain: Secondary | ICD-10-CM

## 2023-03-22 DIAGNOSIS — Z803 Family history of malignant neoplasm of breast: Secondary | ICD-10-CM

## 2023-03-22 DIAGNOSIS — E039 Hypothyroidism, unspecified: Secondary | ICD-10-CM | POA: Diagnosis not present

## 2023-03-22 DIAGNOSIS — Z1231 Encounter for screening mammogram for malignant neoplasm of breast: Secondary | ICD-10-CM

## 2023-03-22 DIAGNOSIS — Z30431 Encounter for routine checking of intrauterine contraceptive device: Secondary | ICD-10-CM

## 2023-03-22 DIAGNOSIS — N6489 Other specified disorders of breast: Secondary | ICD-10-CM

## 2023-03-22 DIAGNOSIS — Z01419 Encounter for gynecological examination (general) (routine) without abnormal findings: Secondary | ICD-10-CM | POA: Diagnosis not present

## 2023-03-22 DIAGNOSIS — F411 Generalized anxiety disorder: Secondary | ICD-10-CM

## 2023-03-22 DIAGNOSIS — Z9189 Other specified personal risk factors, not elsewhere classified: Secondary | ICD-10-CM

## 2023-03-22 DIAGNOSIS — Z1501 Genetic susceptibility to malignant neoplasm of breast: Secondary | ICD-10-CM

## 2023-03-22 MED ORDER — SERTRALINE HCL 100 MG PO TABS
ORAL_TABLET | ORAL | 3 refills | Status: DC
Start: 2023-03-22 — End: 2024-04-18

## 2023-03-22 NOTE — Patient Instructions (Addendum)
I value your feedback and you entrusting [place] with your care. If you get a [place] patient survey, I would appreciate you taking the time to let us know about your experience today. Thank you!  Centralized scheduling at [facility] [phone], option #3

## 2023-03-23 LAB — CBC WITH DIFFERENTIAL/PLATELET
Basophils Absolute: 0.1 10*3/uL (ref 0.0–0.2)
Basos: 1 %
EOS (ABSOLUTE): 0.2 10*3/uL (ref 0.0–0.4)
Eos: 2 %
Hematocrit: 45.8 % (ref 34.0–46.6)
Hemoglobin: 14.9 g/dL (ref 11.1–15.9)
Immature Grans (Abs): 0 10*3/uL (ref 0.0–0.1)
Immature Granulocytes: 0 %
Lymphocytes Absolute: 2.9 10*3/uL (ref 0.7–3.1)
Lymphs: 33 %
MCH: 30.4 pg (ref 26.6–33.0)
MCHC: 32.5 g/dL (ref 31.5–35.7)
MCV: 94 fL (ref 79–97)
Monocytes Absolute: 0.9 10*3/uL (ref 0.1–0.9)
Monocytes: 10 %
Neutrophils Absolute: 4.8 10*3/uL (ref 1.4–7.0)
Neutrophils: 54 %
Platelets: 290 10*3/uL (ref 150–450)
RBC: 4.9 x10E6/uL (ref 3.77–5.28)
RDW: 12.1 % (ref 11.7–15.4)
WBC: 8.8 10*3/uL (ref 3.4–10.8)

## 2023-03-23 LAB — COMPREHENSIVE METABOLIC PANEL
ALT: 16 [IU]/L (ref 0–32)
AST: 18 [IU]/L (ref 0–40)
Albumin: 4.5 g/dL (ref 3.9–4.9)
Alkaline Phosphatase: 117 [IU]/L (ref 44–121)
BUN/Creatinine Ratio: 13 (ref 9–23)
BUN: 10 mg/dL (ref 6–20)
Bilirubin Total: 0.5 mg/dL (ref 0.0–1.2)
CO2: 26 mmol/L (ref 20–29)
Calcium: 9.4 mg/dL (ref 8.7–10.2)
Chloride: 98 mmol/L (ref 96–106)
Creatinine, Ser: 0.79 mg/dL (ref 0.57–1.00)
Globulin, Total: 3 g/dL (ref 1.5–4.5)
Glucose: 68 mg/dL — ABNORMAL LOW (ref 70–99)
Potassium: 4 mmol/L (ref 3.5–5.2)
Sodium: 138 mmol/L (ref 134–144)
Total Protein: 7.5 g/dL (ref 6.0–8.5)
eGFR: 98 mL/min/{1.73_m2} (ref >59)

## 2023-03-23 LAB — TSH+FREE T4
Free T4: 1.41 ng/dL (ref 0.82–1.77)
TSH: 1.84 u[IU]/mL (ref 0.450–4.500)

## 2023-03-26 ENCOUNTER — Other Ambulatory Visit: Payer: Self-pay | Admitting: Obstetrics and Gynecology

## 2023-03-26 MED ORDER — LEVOTHYROXINE SODIUM 75 MCG PO TABS: 75.0000 ug | ORAL_TABLET | Freq: Every day | ORAL | 3 refills | Status: DC

## 2023-03-26 NOTE — Progress Notes (Signed)
Rx RF levo 75 mcg. Normal thyroid labs. Recheck in 1 yr.

## 2023-03-27 ENCOUNTER — Encounter: Payer: Self-pay | Admitting: Obstetrics and Gynecology

## 2023-03-28 ENCOUNTER — Ambulatory Visit
Admission: RE | Admit: 2023-03-28 | Discharge: 2023-03-28 | Disposition: A | Discharge status: Home / Self Care | Payer: BC Managed Care – PPO | Source: Ambulatory Visit | Attending: Obstetrics and Gynecology | Admitting: Obstetrics and Gynecology

## 2023-03-28 DIAGNOSIS — R1031 Right lower quadrant pain: Secondary | ICD-10-CM | POA: Diagnosis not present

## 2023-03-28 DIAGNOSIS — N854 Malposition of uterus: Secondary | ICD-10-CM | POA: Diagnosis not present

## 2023-03-28 DIAGNOSIS — N83292 Other ovarian cyst, left side: Secondary | ICD-10-CM | POA: Diagnosis not present

## 2023-04-02 ENCOUNTER — Encounter: Payer: Self-pay | Admitting: Obstetrics and Gynecology

## 2023-04-09 ENCOUNTER — Encounter: Payer: Self-pay | Admitting: Obstetrics and Gynecology

## 2023-04-09 NOTE — Telephone Encounter (Signed)
Pls call radiology to get results from 10/16 u/s. Thx.

## 2023-04-09 NOTE — Telephone Encounter (Signed)
U/S report complete.

## 2023-04-16 ENCOUNTER — Ambulatory Visit
Admission: RE | Admit: 2023-04-16 | Discharge: 2023-04-16 | Disposition: A | Discharge status: Home / Self Care | Payer: BC Managed Care – PPO | Source: Ambulatory Visit | Attending: Surgery | Admitting: Surgery

## 2023-04-16 DIAGNOSIS — Z1231 Encounter for screening mammogram for malignant neoplasm of breast: Secondary | ICD-10-CM | POA: Diagnosis not present

## 2023-04-19 ENCOUNTER — Other Ambulatory Visit: Payer: Self-pay | Admitting: Surgery

## 2023-04-19 DIAGNOSIS — R928 Other abnormal and inconclusive findings on diagnostic imaging of breast: Secondary | ICD-10-CM

## 2023-04-26 ENCOUNTER — Ambulatory Visit
Admission: RE | Admit: 2023-04-26 | Discharge: 2023-04-26 | Disposition: A | Discharge status: Home / Self Care | Payer: BC Managed Care – PPO | Source: Ambulatory Visit | Attending: Surgery | Admitting: Surgery

## 2023-04-26 DIAGNOSIS — R928 Other abnormal and inconclusive findings on diagnostic imaging of breast: Secondary | ICD-10-CM | POA: Insufficient documentation

## 2023-04-26 DIAGNOSIS — R92333 Mammographic heterogeneous density, bilateral breasts: Secondary | ICD-10-CM | POA: Diagnosis not present

## 2023-04-26 DIAGNOSIS — Z803 Family history of malignant neoplasm of breast: Secondary | ICD-10-CM | POA: Diagnosis not present

## 2023-07-02 ENCOUNTER — Ambulatory Visit: Payer: BC Managed Care – PPO | Admitting: Surgery

## 2023-07-02 ENCOUNTER — Encounter: Payer: Self-pay | Admitting: Surgery

## 2023-07-02 VITALS — BP 132/79 | HR 82 | Temp 98.4°F | Ht 59.0 in | Wt 165.2 lb

## 2023-07-02 DIAGNOSIS — N6489 Other specified disorders of breast: Secondary | ICD-10-CM | POA: Diagnosis not present

## 2023-07-02 NOTE — Patient Instructions (Signed)
 Breast Self-Awareness Breast self-awareness means being familiar with how your breasts look and feel. It involves checking your breasts regularly and telling your health care provider about any changes. Practicing breast self-awareness helps to maintain breast health. Sometimes, changes are not harmful (are benign). Other times, a change in your breasts can be a sign of a serious medical problem. Being familiar with the look and feel of your breasts can help you catch a breast problem while it is still small and can be treated. You should do breast self-exams even if you have breast implants. What you need: A mirror. A well-lit room. A pillow or other soft object. How to do a breast self-exam A breast self-exam is one way to learn what is normal for your breasts and whether your breasts are changing. To do a breast self-exam: Look for changes  Remove all the clothing above your waist. Stand in front of a mirror in a room with good lighting. Put your hands down at your sides. Compare your breasts in the mirror. Look for differences between them (asymmetry), such as: Differences in shape. Differences in size. Puckers, dips, and bumps in one breast and not the other. Look at each breast for changes in the skin, such as: Redness. Scaly areas. Skin thickening. Dimpling. Open sores (ulcers). Look for changes in your nipples, such as: Discharge. Bleeding. Dimpling. Redness. A nipple that looks pushed in (retracted), or that has changed position. Feel for changes Carefully feel your breasts for lumps and changes. It is best to do this self-exam while lying down. Follow these steps to feel each breast: Place a pillow under the shoulder of one side of your body. Place the arm of that side of your body behind your head. Feel the breast of that side of your body using the hand of the opposite arm. To do this: Start in the nipple area and use the pads of your three middle fingers to make -inch  (2 cm) overlapping circles. Use light, medium, and then firm pressure as you feel your breast, gently covering the entire breast area and armpit. Continue the overlapping circles, moving downward over the breast until you feel your ribs below your breast. Then, make circles with your fingers going upward until you reach your collarbone. Next, make circles by moving outward across your breast and into your armpit area. Squeeze the nipple. Check for discharge and lumps. Repeat steps 1-7 to check your other breast. Sit or stand in the tub or shower. With soapy water on your skin, feel each breast the same way you did when you were lying down. Write down what you find Writing down what you find can help you remember what to discuss with your health care provider. Write down: What is normal for each breast. Any changes that you find in each breast. These include: The kind of changes you find. Any pain or tenderness. Size and location of any lumps. Where you are in your menstrual cycle, if you are still getting your menstrual period (menstruating). General tips If you are breastfeeding, the best time to examine your breasts is after a feeding or after using a breast pump. If you menstruate, the best time to examine your breasts is 5-7 days after your menstrual period. Breasts are generally lumpier during menstrual periods, and it may be more difficult to notice changes. With time and practice, you will become more familiar with the differences in your breasts and more comfortable with the exam. Contact a health care  provider if: You see a change in the shape or size of your breasts or nipples. You see a change in the skin of your breast or nipples, such as a reddened or scaly area. You have unusual discharge from your nipples. You find a new lump or thick area. You have breast pain. You have any concerns about your breast health. Summary Breast self-awareness includes looking for physical  changes in your breasts and feeling for any changes within your breasts. Breast self-awareness should be done in front of a mirror in a well-lit room. If you menstruate, the best time to examine your breasts is 5-7 days after your menstrual period. Tell your health care provider about any changes you notice in your breasts. Changes include changes in size, changes on the skin, pain or tenderness, or unusual fluid from your nipples. This information is not intended to replace advice given to you by your health care provider. Make sure you discuss any questions you have with your health care provider. Document Revised: 11/10/2021 Document Reviewed: 04/07/2021 Elsevier Patient Education  2024 [employer].

## 2023-07-03 ENCOUNTER — Other Ambulatory Visit: Payer: Self-pay | Admitting: Surgery

## 2023-07-03 DIAGNOSIS — Z1231 Encounter for screening mammogram for malignant neoplasm of breast: Secondary | ICD-10-CM

## 2023-07-05 ENCOUNTER — Encounter: Payer: Self-pay | Admitting: Surgery

## 2023-07-05 NOTE — Progress Notes (Signed)
Outpatient Surgical Follow Up   Tara Osborn is an 38 y.o. female.   Chief Complaint  Patient presents with   Follow-up    Right breast lumpectomy 06/13/2022    HPI: Tara Osborn is a 38 y.o. female,  Hx of Monoallelic mutation of BRIP1 gene.    She has a Sister with triple negative breast CA at age 66, she has a niece that had colon CA as well . She does have family  ancestry with Ashkenazi heritage. She does have Hx of lap bilateral salpingectomy 04/2022. She did have a right lumpectomy by me 05/2022 for radial scar no evidence of malignancy, chronic fibrocystic changes. She now Comes after a recent mammogram and there was a possible asymmetry on the right breast.  She did have diagnostic mammogram with compression with resolution of asymmetry.  There is no evidence of malignancy.  Please note that I have personally reviewed the mammograms. [name] is doing well and reports no breast issues.  No palpable masses.  Some weight gain.  No fevers no chills  Past Medical History:  Diagnosis Date   Anxiety    Family history of breast cancer    Genetic testing of female 11/2016   BRIP1 POSITIVE   Headache    High risk of ovarian cancer    Hypothyroidism    Increased risk of breast cancer 12/2016   based on family hx-->IBIS=23.9%, riskscore=20.4%   Monoallelic mutation of BRIP1 gene 12/2016   [facility] MyRisk; increased ovar cancer risk    Past Surgical History:  Procedure Laterality Date   BREAST BIOPSY Right 05/17/2022   [place] Core Bx,ribbon clip 830 FIBROSIS,   BREAST BIOPSY Right 05/17/2022   [place] Core Bx, coil clip 9:00,FIBROSIS   BREAST BIOPSY Right 05/17/2022   [place] RT BREAST BX W LOC DEV 1ST LESION IMG BX SPEC US GUIDE 05/17/2022 [facility]   BREAST BIOPSY Right 05/17/2022   [place] RT BREAST BX W LOC DEV EA ADD LESION IMG BX SPEC US GUIDE 05/17/2022 [facility]   BREAST BIOPSY Right 06/07/2022   MM RT RADIO FREQUENCY TAG LOC MAMMO GUIDE 06/07/2022  [facility]   BREAST LUMPECTOMY WITH RADIOFREQUENCY TAG IDENTIFICATION Right 06/13/2022   Procedure: BREAST LUMPECTOMY WITH RADIOFREQUENCY TAG IDENTIFICATION, RNFA to assist;  Surgeon: Leafy Ro, MD;  Location: [facility];  Service: General;  Laterality: Right;   FOOT GANGLION EXCISION Left    INTRAUTERINE DEVICE (IUD) INSERTION N/A 05/08/2022   Procedure: INTRAUTERINE DEVICE (IUD) INSERTION;  Surgeon: Hildred Laser, MD;  Location: [facility];  Service: Gynecology;  Laterality: N/A;   IUD REMOVAL N/A 05/08/2022   Procedure: INTRAUTERINE DEVICE (IUD) REMOVAL;  Surgeon: Hildred Laser, MD;  Location: [facility];  Service: Gynecology;  Laterality: N/A;   LAPAROSCOPIC BILATERAL SALPINGECTOMY Bilateral 05/08/2022   Procedure: LAPAROSCOPIC BILATERAL SALPINGECTOMY;  Surgeon: Hildred Laser, MD;  Location: [facility];  Service: Gynecology;  Laterality: Bilateral;   LAPAROSCOPIC OVARIAN CYSTECTOMY Right 05/08/2022   Procedure: RIGHT OVARIAN CYSTECTOMY;  Surgeon: Hildred Laser, MD;  Location: [facility];  Service: Gynecology;  Laterality: Right;    Family History  Problem Relation Age of Onset   Breast cancer Sister 48       triple negative; currently 32   Hypothyroidism Sister    Hypertension Mother    Hypothyroidism Mother    Stroke Mother    Hypertension Father    Hypothyroidism Father    Other Maternal Grandfather        myocardial infarction   Other  Paternal Grandfather        myocardial infarction   Colon cancer Niece 51    Social History:  reports that she has never smoked. She has never used smokeless tobacco. She reports that she does not drink alcohol and does not use drugs.  Allergies: No Known Allergies  Medications reviewed.    ROS Full ROS performed and is otherwise negative other than what is stated in HPI   BP 132/79   Pulse 82   Temp 98.4 F (36.9 C) (Oral)   Ht 4\' 11"  (1.499 m)   Wt 165 lb 3.2 oz (74.9 kg)   SpO2 98%   BMI 33.37 kg/m   Physical  Exam  Physical Exam CONSTITUTIONAL: NAD. EYES: Pupils are equal, round,  Sclera are non-icteric. LYMPH NODES:  Lymph nodes in the neck are normal. Neck w/o megalies or lesions BREAST : No evidence of any palpable lesions in either breast. Some fibrocystic changes on both breast, although I do not feel a discrete worrisome mass,  Nipple and skin is normal. Right lumpectomy scar with very nice contour and w/o deformities, Bilateral axillas are free of any lymphadenopathy RESPIRATORY:  Lungs are clear. There is normal respiratory effort, with equal breath sounds bilaterally, and without pathologic use of accessory muscles. CARDIOVASCULAR: Heart is regular without murmurs, gallops, or rubs. GI: The abdomen is  soft, nontender, and nondistended. There are no palpable masses. There is no hepatosplenomegaly. There are normal bowel sounds in all quadrants. GU: Rectal deferred.   MUSCULOSKELETAL: Normal muscle strength and tone. No cyanosis or edema.   SKIN: Turgor is good and there are no pathologic skin lesions or ulcers. NEUROLOGIC: Motor and sensation is grossly normal. Cranial nerves are grossly intact. PSYCH:  Oriented to person, place and time. Affect is normal.     Assessment/Plan:\ 38 yo s/p right lumpectomy for radial scar and  significant family history of breast cancer.  Recent asymmetry on mammogram disappeared upon compression.  Currently no need for further biopsies or intervention at this time Given the strong family history I do recommend further both mammogram and MRIs for f/u.  She will Have MRI on JUly and another mammo in October, I will see her after that   Please note that I spent 30 minutes in the encounter including  coordinating her care, placing orders, performing appropriate documentation.     Sterling Big, MD [facility] General Surgeon

## 2023-07-06 DIAGNOSIS — G5602 Carpal tunnel syndrome, left upper limb: Secondary | ICD-10-CM | POA: Diagnosis not present

## 2023-08-29 ENCOUNTER — Ambulatory Visit: Payer: BC Managed Care – PPO | Admitting: Nurse Practitioner

## 2023-08-29 ENCOUNTER — Ambulatory Visit: Payer: Self-pay | Admitting: Nurse Practitioner

## 2023-08-29 ENCOUNTER — Telehealth: Payer: Self-pay | Admitting: Nurse Practitioner

## 2023-08-29 ENCOUNTER — Encounter: Payer: Self-pay | Admitting: Nurse Practitioner

## 2023-08-29 ENCOUNTER — Ambulatory Visit: Admitting: Nurse Practitioner

## 2023-08-29 ENCOUNTER — Other Ambulatory Visit: Payer: Self-pay

## 2023-08-29 ENCOUNTER — Other Ambulatory Visit: Payer: Self-pay | Admitting: Nurse Practitioner

## 2023-08-29 VITALS — BP 120/72 | HR 92 | Resp 16 | Ht 59.0 in | Wt 167.7 lb

## 2023-08-29 DIAGNOSIS — Z114 Encounter for screening for human immunodeficiency virus [HIV]: Secondary | ICD-10-CM | POA: Diagnosis not present

## 2023-08-29 DIAGNOSIS — Z13220 Encounter for screening for lipoid disorders: Secondary | ICD-10-CM | POA: Diagnosis not present

## 2023-08-29 DIAGNOSIS — Z130 Encounter for screening for diseases of the blood and blood-forming organs and certain disorders involving the immune mechanism: Secondary | ICD-10-CM

## 2023-08-29 DIAGNOSIS — Z131 Encounter for screening for diabetes mellitus: Secondary | ICD-10-CM | POA: Diagnosis not present

## 2023-08-29 DIAGNOSIS — Z7689 Persons encountering health services in other specified circumstances: Secondary | ICD-10-CM | POA: Diagnosis not present

## 2023-08-29 DIAGNOSIS — E039 Hypothyroidism, unspecified: Secondary | ICD-10-CM

## 2023-08-29 DIAGNOSIS — F419 Anxiety disorder, unspecified: Secondary | ICD-10-CM

## 2023-08-29 DIAGNOSIS — Z1159 Encounter for screening for other viral diseases: Secondary | ICD-10-CM | POA: Diagnosis not present

## 2023-08-29 DIAGNOSIS — G43E09 Chronic migraine with aura, not intractable, without status migrainosus: Secondary | ICD-10-CM

## 2023-08-29 DIAGNOSIS — Z1501 Genetic susceptibility to malignant neoplasm of breast: Secondary | ICD-10-CM

## 2023-08-29 DIAGNOSIS — Z803 Family history of malignant neoplasm of breast: Secondary | ICD-10-CM

## 2023-08-29 DIAGNOSIS — Z1589 Genetic susceptibility to other disease: Secondary | ICD-10-CM

## 2023-08-29 MED ORDER — NURTEC 75 MG PO TBDP
75.0000 mg | ORAL_TABLET | ORAL | 5 refills | Status: DC | PRN
Start: 2023-08-29 — End: 2024-03-31

## 2023-08-29 NOTE — Progress Notes (Signed)
 BP 120/72 (Cuff Size: Normal)   Pulse 92   Resp 16   Ht 4\' 11"  (1.499 m)   Wt 167 lb 11.2 oz (76.1 kg)   SpO2 96%   BMI 33.87 kg/m    Subjective:    Patient ID: Tara Osborn, female    DOB: [birth year], 38 y.o.   MRN: [id number]  HPI: Tara Osborn is a 38 y.o. female  Chief Complaint  Patient presents with   Establish Care    Discussed the use of AI scribe software for clinical note transcription with the patient, who gave verbal consent to proceed.  History of Present Illness   The patient is a 38 year old with hypothyroidism, anxiety, and migraines who presents to establish care.  She has a history of hypothyroidism and is experiencing weight gain and persistent fatigue. Her thyroid levels were last checked in the fall of the previous year. She is currently taking levothyroxine 75 mcg daily.  She has a history of anxiety and is on Zoloft 100 mg daily, which helps with her panic attacks. Her anxiety is manageable with the medication, although she still experiences worry and overthinking at times.  She has a long-standing history of migraines, experiencing them a couple of times a month, lasting two to four days. She describes the migraines as causing a 'band of pressure' around her face and sometimes leading to a 'brain fog.' She has tried Nurtec in the past, which was effective, but stopped due to insurance issues. She currently manages her migraines with Advil and occasionally uses a peppermint oil roll-on for relief. She has also tried Imitrex in the past but did not like it.  There is a significant family history of breast cancer, with her sister having been diagnosed with triple negative breast cancer at age 55. She has a BR1P1 gene mutation and underwent a right breast lumpectomy on June 13, 2022, due to a radial scar. Her last mammogram was on April 26, 2023.       08/29/2023    7:42 AM 01/06/2020    2:26 PM 09/06/2017    9:18 AM  Depression screen PHQ 2/9   Decreased Interest 0 0 0  Down, Depressed, Hopeless 0 0 0  PHQ - 2 Score 0 0 0  Altered sleeping 1 2 1   Tired, decreased energy 1 2 0  Change in appetite 1 0 0  Feeling bad or failure about yourself  0 1 0  Trouble concentrating 1 0 1  Moving slowly or fidgety/restless 1 0 1  Suicidal thoughts 0 0 0  PHQ-9 Score 5 5 3   Difficult doing work/chores Not difficult at all  Not difficult at all       08/29/2023    7:43 AM 01/06/2020    2:26 PM 09/06/2017    9:18 AM  GAD 7 : Generalized Anxiety Score  Nervous, Anxious, on Edge 1 1 1   Control/stop worrying 1 1 0  Worry too much - different things 1 0 0  Trouble relaxing 1 1 1   Restless 1 0 1  Easily annoyed or irritable 1 1 1   Afraid - awful might happen 1 0 0  Total GAD 7 Score 7 4 4   Anxiety Difficulty Not difficult at all Not difficult at all Not difficult at all     Relevant past medical, surgical, family and social history reviewed and updated as indicated. Interim medical history since our last visit reviewed. Allergies and medications reviewed and updated.  Review of Systems  Constitutional: Negative for fever or weight change.  Respiratory: Negative for cough and shortness of breath.   Cardiovascular: Negative for chest pain or palpitations.  Gastrointestinal: Negative for abdominal pain, no bowel changes.  Musculoskeletal: Negative for gait problem or joint swelling.  Skin: Negative for rash.  Neurological: Negative for dizziness or headache.  No other specific complaints in a complete review of systems (except as listed in HPI above).      Objective:    BP 120/72 (Cuff Size: Normal)   Pulse 92   Resp 16   Ht 4\' 11"  (1.499 m)   Wt 167 lb 11.2 oz (76.1 kg)   SpO2 96%   BMI 33.87 kg/m    Wt Readings from Last 3 Encounters:  08/29/23 167 lb 11.2 oz (76.1 kg)  07/02/23 165 lb 3.2 oz (74.9 kg)  03/22/23 161 lb (73 kg)    Physical Exam Vitals reviewed.  Constitutional:      Appearance: Normal appearance.   HENT:     Head: Normocephalic.  Cardiovascular:     Rate and Rhythm: Normal rate and regular rhythm.  Pulmonary:     Effort: Pulmonary effort is normal.     Breath sounds: Normal breath sounds.  Musculoskeletal:        General: Normal range of motion.  Skin:    General: Skin is warm and dry.  Neurological:     General: No focal deficit present.     Mental Status: She is alert and oriented to person, place, and time. Mental status is at baseline.  Psychiatric:        Mood and Affect: Mood normal.        Behavior: Behavior normal.        Thought Content: Thought content normal.        Judgment: Judgment normal.     Results for orders placed or performed in visit on 03/22/23  TSH + free T4   Collection Time: 03/22/23  9:19 AM  Result Value Ref Range   TSH 1.840 0.450 - 4.500 uIU/mL   Free T4 1.41 0.82 - 1.77 ng/dL  Comprehensive metabolic panel   Collection Time: 03/22/23  9:19 AM  Result Value Ref Range   Glucose 68 (L) 70 - 99 mg/dL   BUN 10 6 - 20 mg/dL   Creatinine, Ser 1.61 0.57 - 1.00 mg/dL   eGFR 98 >09 UE/AVW/0.98   BUN/Creatinine Ratio 13 9 - 23   Sodium 138 134 - 144 mmol/L   Potassium 4.0 3.5 - 5.2 mmol/L   Chloride 98 96 - 106 mmol/L   CO2 26 20 - 29 mmol/L   Calcium 9.4 8.7 - 10.2 mg/dL   Total Protein 7.5 6.0 - 8.5 g/dL   Albumin 4.5 3.9 - 4.9 g/dL   Globulin, Total 3.0 1.5 - 4.5 g/dL   Bilirubin Total 0.5 0.0 - 1.2 mg/dL   Alkaline Phosphatase 117 44 - 121 IU/L   AST 18 0 - 40 IU/L   ALT 16 0 - 32 IU/L  CBC with Differential/Platelet   Collection Time: 03/22/23  9:19 AM  Result Value Ref Range   WBC 8.8 3.4 - 10.8 x10E3/uL   RBC 4.90 3.77 - 5.28 x10E6/uL   Hemoglobin 14.9 11.1 - 15.9 g/dL   Hematocrit 11.9 14.7 - 46.6 %   MCV 94 79 - 97 fL   MCH 30.4 26.6 - 33.0 pg   MCHC 32.5 31.5 - 35.7 g/dL   RDW 12.1  11.7 - 15.4 %   Platelets 290 150 - 450 x10E3/uL   Neutrophils 54 Not Estab. %   Lymphs 33 Not Estab. %   Monocytes 10 Not Estab. %   Eos  2 Not Estab. %   Basos 1 Not Estab. %   Neutrophils Absolute 4.8 1.4 - 7.0 x10E3/uL   Lymphocytes Absolute 2.9 0.7 - 3.1 x10E3/uL   Monocytes Absolute 0.9 0.1 - 0.9 x10E3/uL   EOS (ABSOLUTE) 0.2 0.0 - 0.4 x10E3/uL   Basophils Absolute 0.1 0.0 - 0.2 x10E3/uL   Immature Granulocytes 0 Not Estab. %   Immature Grans (Abs) 0.0 0.0 - 0.1 x10E3/uL       Assessment & Plan:   Problem List Items Addressed This Visit       Cardiovascular and Mediastinum   Chronic migraine with aura without status migrainosus, not intractable   Relevant Medications   Rimegepant Sulfate (NURTEC) 75 MG TBDP     Endocrine   Acquired hypothyroidism - Primary   Relevant Orders   TSH     Other   Monoallelic mutation of BRIP1 gene   Family history of breast cancer   Anxiety   Other Visit Diagnoses       Encounter to establish care         Screening for HIV without presence of risk factors       Relevant Orders   HIV Antibody (routine testing w rflx)     Encounter for hepatitis C screening test for low risk patient       Relevant Orders   Hepatitis C antibody     Screening for deficiency anemia       Relevant Orders   CBC with Differential/Platelet     Screening for cholesterol level       Relevant Orders   Lipid panel     Screening for diabetes mellitus       Relevant Orders   COMPLETE METABOLIC PANEL WITH GFR   Hemoglobin A1c        Assessment and Plan    Hypothyroidism Weight gain and fatigue suggest suboptimal control. Last thyroid function test was in fall 2024. Reports neck swelling, not evaluated this visit. - Order thyroid function tests to assess current thyroid hormone levels. - Review results via MyChart and adjust levothyroxine dosage if necessary.  Migraine Migraines occur 2-4 times monthly, with aura in the left eye, facial pain, and pressure. Nurtec was effective but discontinued due to insurance. Currently uses Advil and peppermint oil with variable effectiveness. Open to  retrying Nurtec with improved insurance coverage. - Prescribe Nurtec and verify insurance coverage. - Consider alternative treatments if Nurtec is not covered, including injections or nasal spray options.  Anxiety Anxiety managed with Zoloft 100 mg daily, effectively controlling panic attacks. Occasional worry without significant daily interference. - Continue Zoloft 100 mg daily.  Breast cancer risk Family history of breast cancer and BRIP1 gene mutation increase risk. Underwent right breast lumpectomy in December 2023 for a radial scar. Last mammogram was in November 2024. - Monitor for new breast symptoms and ensure regular follow-up with breast specialist.        Follow up plan: Return in about 6 months (around 02/29/2024) for follow up.

## 2023-08-29 NOTE — Telephone Encounter (Signed)
 Prior authorization from [employer]  Rimegepant Sulfate (NURTEC) 75 MG TBDP    Pt Id: [id number]

## 2023-08-30 ENCOUNTER — Encounter: Payer: Self-pay | Admitting: Nurse Practitioner

## 2023-08-30 LAB — COMPLETE METABOLIC PANEL WITH GFR
AG Ratio: 1.4 (calc) (ref 1.0–2.5)
ALT: 18 U/L (ref 6–29)
AST: 18 U/L (ref 10–30)
Albumin: 4.4 g/dL (ref 3.6–5.1)
Alkaline phosphatase (APISO): 99 U/L (ref 31–125)
BUN: 14 mg/dL (ref 7–25)
CO2: 31 mmol/L (ref 20–32)
Calcium: 10.1 mg/dL (ref 8.6–10.2)
Chloride: 101 mmol/L (ref 98–110)
Creat: 0.77 mg/dL (ref 0.50–0.99)
Globulin: 3.2 g/dL (ref 1.9–3.7)
Glucose, Bld: 97 mg/dL (ref 65–99)
Potassium: 4.6 mmol/L (ref 3.5–5.3)
Sodium: 138 mmol/L (ref 135–146)
Total Bilirubin: 0.4 mg/dL (ref 0.2–1.2)
Total Protein: 7.6 g/dL (ref 6.1–8.1)
eGFR: 100 mL/min/{1.73_m2} (ref >=60)

## 2023-08-30 LAB — TSH: TSH: 2.01 m[IU]/L

## 2023-08-30 LAB — CBC WITH DIFFERENTIAL/PLATELET
Absolute Lymphocytes: 2232 {cells}/uL (ref 850–3900)
Absolute Monocytes: 601 {cells}/uL (ref 200–950)
Basophils Absolute: 50 {cells}/uL (ref 0–200)
Basophils Relative: 0.8 %
Eosinophils Absolute: 161 {cells}/uL (ref 15–500)
Eosinophils Relative: 2.6 %
HCT: 42.4 % (ref 35.0–45.0)
Hemoglobin: 14.3 g/dL (ref 11.7–15.5)
MCH: 30.5 pg (ref 27.0–33.0)
MCHC: 33.7 g/dL (ref 32.0–36.0)
MCV: 90.4 fL (ref 80.0–100.0)
MPV: 9.3 fL (ref 7.5–12.5)
Monocytes Relative: 9.7 %
Neutro Abs: 3156 {cells}/uL (ref 1500–7800)
Neutrophils Relative %: 50.9 %
Platelets: 293 10*3/uL (ref 140–400)
RBC: 4.69 10*6/uL (ref 3.80–5.10)
RDW: 12.4 % (ref 11.0–15.0)
Total Lymphocyte: 36 %
WBC: 6.2 10*3/uL (ref 3.8–10.8)

## 2023-08-30 LAB — HEMOGLOBIN A1C
Hgb A1c MFr Bld: 5.5 %{Hb} (ref <5.7)
Mean Plasma Glucose: 111 mg/dL
eAG (mmol/L): 6.2 mmol/L

## 2023-08-30 LAB — LIPID PANEL
Cholesterol: 248 mg/dL — ABNORMAL HIGH (ref <200)
HDL: 40 mg/dL — ABNORMAL LOW (ref >=50)
LDL Cholesterol (Calc): 180 mg/dL — ABNORMAL HIGH
Non-HDL Cholesterol (Calc): 208 mg/dL — ABNORMAL HIGH (ref <130)
Total CHOL/HDL Ratio: 6.2 (calc) — ABNORMAL HIGH (ref <5.0)
Triglycerides: 138 mg/dL (ref <150)

## 2023-08-30 LAB — HEPATITIS C ANTIBODY: Hepatitis C Ab: NONREACTIVE

## 2023-08-30 LAB — HIV ANTIBODY (ROUTINE TESTING W REFLEX): HIV 1&2 Ab, 4th Generation: NONREACTIVE

## 2023-08-30 NOTE — Telephone Encounter (Signed)
 Additional Information Required This medication or product was previously approved on [id number] from 2023-08-29 to 2024-02-29.

## 2023-08-30 NOTE — Telephone Encounter (Signed)
 Approved.

## 2023-08-30 NOTE — Telephone Encounter (Signed)
 Requested medications are due for refill today.  no  Requested medications are on the active medications list.  yes  Last refill. 08/29/2023 #16 5 rf  Future visit scheduled.   yes  Notes to clinic.  Pharmacy comment: Alternative Requested:REQUIRES PRIOR AUTH FOR INSURANCE TO COVER.    Requested Prescriptions  Pending Prescriptions Disp Refills   NURTEC 75 MG TBDP [Pharmacy Med Name: NURTEC ODT 75 MG TABLET] 16 tablet 5    Sig: Take 1 tablet (75 mg total) by mouth as needed (for migraines).     Off-Protocol Failed - 08/30/2023 11:34 AM      Failed - Medication not assigned to a protocol, review manually.      Failed - Valid encounter within last 12 months    Recent Outpatient Visits   None     Future Appointments             In 6 months Zane Herald, Rudolpho Sevin, FNP [facility], [facility]

## 2023-11-21 ENCOUNTER — Encounter: Payer: Self-pay | Admitting: Surgery

## 2023-11-21 ENCOUNTER — Ambulatory Visit: Admitting: Surgery

## 2023-11-21 VITALS — BP 127/79 | HR 74 | Ht 59.0 in | Wt 164.0 lb

## 2023-11-21 DIAGNOSIS — N63 Unspecified lump in unspecified breast: Secondary | ICD-10-CM

## 2023-11-21 DIAGNOSIS — N6489 Other specified disorders of breast: Secondary | ICD-10-CM

## 2023-11-21 NOTE — Patient Instructions (Addendum)
 We will get you scheduled for a mammogram and ultrasound of the area just to be safe.  We will call you about your results and any next steps.    You are scheduled for a Mammogram and Ultrasound of the R breast at [facility] on  Thursday June 12th at 10:20 am.

## 2023-11-22 ENCOUNTER — Other Ambulatory Visit: Payer: Self-pay | Admitting: Medical Genetics

## 2023-11-22 NOTE — Progress Notes (Unsigned)
 Outpatient Surgical Follow Up    Tara Osborn is an 38 y.o. female.   Chief Complaint  Patient presents with   Follow-up    HPI: Tara Osborn is a 38 y.o. female,  Hx of Monoallelic mutation of BRIP1 gene.    She has a Sister with triple negative breast CA at age 57, she has a niece that had colon CA as well . She does have family  ancestry with Ashkenazi heritage. She does have Hx of lap bilateral salpingectomy 04/2022. She did have a right lumpectomy by me 05/2022 for radial scar no evidence of malignancy, chronic fibrocystic changes. She now Comes for fullness and lump on her right breat upper outer quadrant.   She did have diagnostic mammogram with compression with resolution of asymmetry.  There is no evidence of malignancy.  Please note that I have personally reviewed the mammograms 04/2023. [name] is doing well and reports no breast issues.    No fevers no chills  Past Medical History:  Diagnosis Date   Anxiety    Family history of breast cancer    Genetic testing of female 11/2016   BRIP1 POSITIVE   Headache    High risk of ovarian cancer    Hypothyroidism    Increased risk of breast cancer 12/2016   based on family hx-->IBIS=23.9%, riskscore=20.4%   Monoallelic mutation of BRIP1 gene 12/2016   Myriad MyRisk; increased ovar cancer risk    Past Surgical History:  Procedure Laterality Date   BREAST BIOPSY Right 05/17/2022   US  Core Bx,ribbon clip 830 FIBROSIS,   BREAST BIOPSY Right 05/17/2022   Us  Core Bx, coil clip 9:00,FIBROSIS   BREAST BIOPSY Right 05/17/2022   US  RT BREAST BX W LOC DEV 1ST LESION IMG BX SPEC US  GUIDE 05/17/2022 [facility]   BREAST BIOPSY Right 05/17/2022   US  RT BREAST BX W LOC DEV EA ADD LESION IMG BX SPEC US  GUIDE 05/17/2022 [facility]   BREAST BIOPSY Right 06/07/2022   MM RT RADIO FREQUENCY TAG LOC MAMMO GUIDE 06/07/2022 [facility]   BREAST LUMPECTOMY WITH RADIOFREQUENCY TAG IDENTIFICATION Right 06/13/2022    Procedure: BREAST LUMPECTOMY WITH RADIOFREQUENCY TAG IDENTIFICATION, RNFA to assist;  Surgeon: Alben Alma, MD;  Location: [facility];  Service: General;  Laterality: Right;   FOOT GANGLION EXCISION Left    INTRAUTERINE DEVICE (IUD) INSERTION N/A 05/08/2022   Procedure: INTRAUTERINE DEVICE (IUD) INSERTION;  Surgeon: Teresa Fender, MD;  Location: [facility];  Service: Gynecology;  Laterality: N/A;   IUD REMOVAL N/A 05/08/2022   Procedure: INTRAUTERINE DEVICE (IUD) REMOVAL;  Surgeon: Teresa Fender, MD;  Location: [facility];  Service: Gynecology;  Laterality: N/A;   LAPAROSCOPIC BILATERAL SALPINGECTOMY Bilateral 05/08/2022   Procedure: LAPAROSCOPIC BILATERAL SALPINGECTOMY;  Surgeon: Teresa Fender, MD;  Location: [facility];  Service: Gynecology;  Laterality: Bilateral;   LAPAROSCOPIC OVARIAN CYSTECTOMY Right 05/08/2022   Procedure: RIGHT OVARIAN CYSTECTOMY;  Surgeon: Teresa Fender, MD;  Location: [facility];  Service: Gynecology;  Laterality: Right;    Family History  Problem Relation Age of Onset   Hypertension Mother    Hypothyroidism Mother    Stroke Mother    Hypertension Father    Hypothyroidism Father    Cancer Sister    Breast cancer Sister 57       triple negative; currently 84   Hypothyroidism Sister    Other Maternal Grandfather        myocardial infarction   Other Paternal Grandfather  myocardial infarction   Colon cancer Niece 37    Social History:  reports that she has never smoked. She has never been exposed to tobacco smoke. She has never used smokeless tobacco. She reports that she does not drink alcohol and does not use drugs.  Allergies: No Known Allergies  Medications reviewed.    ROS Full ROS performed and is otherwise negative other than what is stated in HPI   BP 127/79   Pulse 74   Ht 4\' 11"  (1.499 m)   Wt 164 lb (74.4 kg)   SpO2 96%   BMI 33.12 kg/m   Physical Exam   Physical Exam CONSTITUTIONAL: NAD. Chaperone Present EYES: Pupils are  equal, round,  Sclera are non-icteric. LYMPH NODES:  Lymph nodes in the neck are normal. Neck w/o megalies or lesions BREAST : Evidence of some fullness and potential cystic lesion located within the upper outer quadrant of her right breast.  Reminiscence of fibrocystic disease,  Nipple and skin is normal. Right lumpectomy scar with very nice contour and w/o deformities, Bilateral axillas are free of any lymphadenopathy.  Left breast completely normal RESPIRATORY:  Lungs are clear. There is normal respiratory effort, with equal breath sounds bilaterally, and without pathologic use of accessory muscles. CARDIOVASCULAR: Heart is regular without murmurs, gallops, or rubs. GI: The abdomen is  soft, nontender, and nondistended. There are no palpable masses. There is no hepatosplenomegaly. There are normal bowel sounds in all quadrants. GU: Rectal deferred.   MUSCULOSKELETAL: Normal muscle strength and tone. No cyanosis or edema.   SKIN: Turgor is good and there are no pathologic skin lesions or ulcers. NEUROLOGIC: Motor and sensation is grossly normal. Cranial nerves are grossly intact. PSYCH:  Oriented to person, place and time. Affect is normal.  Assessment/Plan: 38 yo s/p right lumpectomy for radial scar and  significant family history of breast cancer.  New small right breast palpable lesion likely benign.  Given president and significant history I do think that it is worth doing further investigation with an ultrasound.  We have to see her after that.  Cussed with her about findings.   Please note that I spent 30 minutes in the encounter including  coordinating her care, placing orders, performing appropriate documentation.  Evelia Hipp, MD [facility] General Surgeon

## 2023-11-29 ENCOUNTER — Other Ambulatory Visit
Admission: RE | Admit: 2023-11-29 | Discharge: 2023-11-29 | Disposition: A | Discharge status: Home / Self Care | Source: Ambulatory Visit | Attending: Medical Genetics | Admitting: Medical Genetics

## 2023-11-29 ENCOUNTER — Ambulatory Visit
Admission: RE | Admit: 2023-11-29 | Discharge: 2023-11-29 | Disposition: A | Discharge status: Home / Self Care | Source: Ambulatory Visit | Attending: Surgery | Admitting: Surgery

## 2023-11-29 DIAGNOSIS — R92331 Mammographic heterogeneous density, right breast: Secondary | ICD-10-CM | POA: Diagnosis not present

## 2023-11-29 DIAGNOSIS — N6459 Other signs and symptoms in breast: Secondary | ICD-10-CM | POA: Diagnosis not present

## 2023-11-29 DIAGNOSIS — N6489 Other specified disorders of breast: Secondary | ICD-10-CM | POA: Insufficient documentation

## 2023-11-29 DIAGNOSIS — N63 Unspecified lump in unspecified breast: Secondary | ICD-10-CM | POA: Diagnosis not present

## 2023-11-30 ENCOUNTER — Other Ambulatory Visit: Payer: Self-pay | Admitting: Surgery

## 2023-11-30 DIAGNOSIS — R928 Other abnormal and inconclusive findings on diagnostic imaging of breast: Secondary | ICD-10-CM

## 2023-12-11 ENCOUNTER — Ambulatory Visit
Admission: RE | Admit: 2023-12-11 | Discharge: 2023-12-11 | Disposition: A | Discharge status: Home / Self Care | Source: Ambulatory Visit | Attending: Surgery | Admitting: Surgery

## 2023-12-11 DIAGNOSIS — N6011 Diffuse cystic mastopathy of right breast: Secondary | ICD-10-CM | POA: Diagnosis not present

## 2023-12-11 DIAGNOSIS — R928 Other abnormal and inconclusive findings on diagnostic imaging of breast: Secondary | ICD-10-CM

## 2023-12-11 DIAGNOSIS — N6311 Unspecified lump in the right breast, upper outer quadrant: Secondary | ICD-10-CM | POA: Diagnosis not present

## 2023-12-11 HISTORY — PX: BREAST BIOPSY: SHX20

## 2023-12-11 LAB — GENECONNECT MOLECULAR SCREEN: Genetic Analysis Overall Interpretation: NEGATIVE

## 2023-12-11 MED ORDER — LIDOCAINE 1 % OPTIME INJ - NO CHARGE
2.0000 mL | Freq: Once | INTRAMUSCULAR | Status: AC
Start: 2023-12-11 — End: 2023-12-11
  Administered 2023-12-11: 2 mL
  Filled 2023-12-11: qty 2

## 2023-12-11 MED ORDER — LIDOCAINE-EPINEPHRINE 1 %-1:100000 IJ SOLN
7.0000 mL | Freq: Once | INTRAMUSCULAR | Status: AC
  Administered 2023-12-11: 7 mL
  Filled 2023-12-11: qty 7

## 2023-12-12 LAB — SURGICAL PATHOLOGY

## 2023-12-22 DIAGNOSIS — M25532 Pain in left wrist: Secondary | ICD-10-CM | POA: Diagnosis not present

## 2024-02-01 ENCOUNTER — Telehealth: Payer: Self-pay | Admitting: Pharmacy Technician

## 2024-02-01 ENCOUNTER — Other Ambulatory Visit (HOSPITAL_COMMUNITY): Payer: Self-pay

## 2024-02-01 NOTE — Telephone Encounter (Signed)
 Pharmacy Patient Advocate Encounter   Received notification from CoverMyMeds that prior authorization for Nurtec ODT 75mg  tablets is due for renewal.   Insurance verification completed.   The patient is insured through [facility].  Action: PA required; PA submitted to above mentioned insurance via Latent Key/confirmation #/EOC [facility] Status is pending

## 2024-02-01 NOTE — Telephone Encounter (Signed)
 Pharmacy Patient Advocate Encounter  Received notification from [facility] that Prior Authorization for Nurtec ODT 75mg  tablets has been APPROVED from 02/01/2024 to 01/31/2026.   PA #/Case ID/Reference #: [id number]

## 2024-02-29 ENCOUNTER — Ambulatory Visit: Admitting: Nurse Practitioner

## 2024-03-07 ENCOUNTER — Ambulatory Visit: Admitting: Nurse Practitioner

## 2024-03-12 ENCOUNTER — Encounter: Payer: Self-pay | Admitting: Nurse Practitioner

## 2024-03-12 ENCOUNTER — Ambulatory Visit: Admitting: Nurse Practitioner

## 2024-03-12 VITALS — BP 124/72 | HR 86 | Temp 98.2°F | Resp 18 | Ht 59.0 in | Wt 165.9 lb

## 2024-03-12 DIAGNOSIS — Z23 Encounter for immunization: Secondary | ICD-10-CM | POA: Diagnosis not present

## 2024-03-12 DIAGNOSIS — F419 Anxiety disorder, unspecified: Secondary | ICD-10-CM | POA: Diagnosis not present

## 2024-03-12 DIAGNOSIS — G43E09 Chronic migraine with aura, not intractable, without status migrainosus: Secondary | ICD-10-CM | POA: Diagnosis not present

## 2024-03-12 DIAGNOSIS — E039 Hypothyroidism, unspecified: Secondary | ICD-10-CM | POA: Diagnosis not present

## 2024-03-12 LAB — TSH: TSH: 1.5 m[IU]/L

## 2024-03-12 MED ORDER — ZAVZPRET 10 MG/ACT NA SOLN: 10.0000 mg | NASAL | 1 refills | Status: DC | PRN

## 2024-03-12 NOTE — Progress Notes (Signed)
 BP 124/72   Pulse 86   Temp 98.2 F (36.8 C)   Resp 18   Ht 4' 11 (1.499 m)   Wt 165 lb 14.4 oz (75.3 kg)   SpO2 97%   BMI 33.51 kg/m    Subjective:    Patient ID: Tara Osborn, female    DOB: 1985-10-19, 37 y.o.   MRN: [id number]  HPI: Tara Osborn is a 38 y.o. female  Chief Complaint  Patient presents with   Medical Management of Chronic Issues   Hypothyroidism - Medications: Synthroid  75 mcg daily - Patient is compliant with above medications and reports no side effects. - Denies any fatigue, constipation, unexplained weight gain, or constipation.  Migraines - Medications: Rimegepant Sulfate 75 mg PRN - Patient is compliant with above medications and reports no side effects. - Patient has not had a migraine recently, but continues to have auras. Migraines usually last around 3 to 4 days even when taking Nurtec PRN. Discussed with patient of adding another PRN medication to control symptoms.  Anxiety - Medications: Zoloft  100 mg daily  - Patient is compliant with above medications and reports no side effects. -patient reports she is satisfied with her zoloft  dosage,  GAD score is elevated and PHQ9 score elevated  Family history of breast cancer - Significant family history of breast cancer with sister being diagnosed with triple negative breast cancer at age 56. She has a BR1P1 gene mutation. Underwent a right breast lumpectomy on 06/13/2022 due to a radial scar. - Last mammogram was 04/16/2023. Patient has a scheduled appointment for a mammogram at the end of October.   PHQ-9 is negative for depression.    03/12/2024    8:32 AM 08/29/2023    7:42 AM 01/06/2020    2:26 PM  Depression screen PHQ 2/9  Decreased Interest 1 0 0  Down, Depressed, Hopeless 0 0 0  PHQ - 2 Score 1 0 0  Altered sleeping 2 1 2   Tired, decreased energy 2 1 2   Change in appetite 1 1 0  Feeling bad or failure about yourself  2 0 1  Trouble concentrating 0 1 0  Moving slowly or  fidgety/restless 2 1 0  Suicidal thoughts 0 0 0  PHQ-9 Score 10 5 5   Difficult doing work/chores Not difficult at all Not difficult at all         03/12/2024    8:35 AM 08/29/2023    7:43 AM 01/06/2020    2:26 PM 09/06/2017    9:18 AM  GAD 7 : Generalized Anxiety Score  Nervous, Anxious, on Edge 2 1 1 1   Control/stop worrying 2 1 1  0  Worry too much - different things 2 1 0 0  Trouble relaxing 1 1 1 1   Restless 2 1 0 1  Easily annoyed or irritable 2 1 1 1   Afraid - awful might happen 1 1 0 0  Total GAD 7 Score 12 7 4 4   Anxiety Difficulty Not difficult at all Not difficult at all Not difficult at all Not difficult at all    Relevant past medical, surgical, family and social history reviewed and updated as indicated. Interim medical history since our last visit reviewed. Allergies and medications reviewed and updated.  Review of Systems  Constitutional: Negative for fever or weight change.  Respiratory: Negative for cough and shortness of breath.   Cardiovascular: Negative for chest pain or palpitations.  Gastrointestinal: Negative for abdominal pain, no bowel changes.  Musculoskeletal: Negative for gait problem or joint swelling.  Skin: Negative for rash.  Neurological: Negative for dizziness or headache.  No other specific complaints in a complete review of systems (except as listed in HPI above).     Objective:     BP 124/72   Pulse 86   Temp 98.2 F (36.8 C)   Resp 18   Ht 4' 11 (1.499 m)   Wt 165 lb 14.4 oz (75.3 kg)   SpO2 97%   BMI 33.51 kg/m    Wt Readings from Last 3 Encounters:  03/12/24 165 lb 14.4 oz (75.3 kg)  11/21/23 164 lb (74.4 kg)  08/29/23 167 lb 11.2 oz (76.1 kg)    Physical Exam Constitutional:      Appearance: Normal appearance.  Cardiovascular:     Rate and Rhythm: Normal rate and regular rhythm.     Heart sounds: Normal heart sounds.  Pulmonary:     Effort: Pulmonary effort is normal.     Breath sounds: Normal breath sounds.   Musculoskeletal:     Cervical back: Normal range of motion and neck supple.  Skin:    General: Skin is warm and dry.  Neurological:     General: No focal deficit present.     Mental Status: She is alert and oriented to person, place, and time. Mental status is at baseline.  Psychiatric:        Mood and Affect: Mood normal.        Behavior: Behavior normal.        Thought Content: Thought content normal.        Judgment: Judgment normal.      Results for orders placed or performed during the hospital encounter of 12/11/23  Surgical pathology   Collection Time: 12/11/23 12:00 AM  Result Value Ref Range   SURGICAL PATHOLOGY      SURGICAL PATHOLOGY [facility] [address] [place], [place] [address] Telephone [phone] or [phone] Fax [phone]  REPORT OF SURGICAL PATHOLOGY   Accession #: [id number] Patient Name: Tara Osborn, Tara Osborn Visit # : [id number]  MRN: [id number] Physician: Dewey Shilling DOB/Age 29-Mar-1986 (Age: 35) Gender: F Collected Date: 12/11/2023 Received Date: 12/11/2023  FINAL DIAGNOSIS       1. Breast, right, needle core biopsy, 9:30 6 cmfn, ribbon clip :       - CYSTIC PAPILLARY APOCRINE METAPLASIA.      - NEGATIVE FOR ATYPIA AND MALIGNANCY.      - SEE NOTE.       Diagnosis Note : Sections demonstrate breast tissue with a focal cystic area      lined by benign mammary epithelium with apocrine metaplasia that is focally      arranged into small papillary structures.  These histologic findings are benign.      DATE SIGNED OUT: 12/12/2023 ELECTRONIC SIGNATURE : Janel Md, Rexene , Pathologist, Electronic Signature  [facility] PIC DESCRIPTION  CASE COMMENTS STAINS USED IN DIAGNOSIS: H&E-2 H&E-3 H&E-4 H&E    CLINICAL HISTORY  SPECIMEN(S) OBTAINED 1. Breast, right, needle core biopsy, 9:30 6 Cmfn, Ribbon Clip  SPECIMEN COMMENTS: 1. TIF: 1330, CIT: < , Hx of radial scar, incidental mass noted in  palpable area, family hx of breast cancer SPECIMEN CLINICAL INFORMATION: 1. Likely fibrocystic breast tissue, R/O atypia or malignancy, (ribbon clip)    Gross Description 1. Received in formalin labeled with the patient's name Tara Osborn) and Right breast 9:30 7 cfn are four pieces of  yellow-tan fibrofatty tissue ranging from 0.5 x 0.2 x 0.2 cm to 1.6 x 0.2 x 0.2 cm, submitted in toto in a single cassette. TIF 13:30, CIT <1 minute (ribbon clip).               (LEF, 12/11/2023)        Report signed out from the following location(s) [facility] [address] [place], [place] [address] CLIA #: [id number]  [facility] [address] [place], [place] [address] CLIA #: [id number]           Assessment & Plan:   Problem List Items Addressed This Visit       Cardiovascular and Mediastinum   Chronic migraine with aura without status migrainosus, not intractable   Continues to have migraines with aura that last 3 to 4 days. Takes Nurtec PRN. PRN Zavzpret  ordered.      Relevant Medications   Zavegepant HCl (ZAVZPRET ) 10 MG/ACT SOLN     Endocrine   Acquired hypothyroidism   No concerns at this time. TSH ordered.      Relevant Orders   TSH     Other   Anxiety   Anxiety manageable at this time. Takes Zoloft  100 mg daily.      Other Visit Diagnoses       Immunization due    -  Primary   Relevant Orders   Flu vaccine trivalent PF, 6mos and older(Flulaval,Afluria,Fluarix,Fluzone) (Completed)            Follow up plan: Return in about 6 months (around 09/09/2024) for follow up.   I have reviewed this encounter including the documentation in this note and/or discussed this patient with the provider, Alexa Everhart SNP, I am certifying that I agree with the content of this note as supervising/preceptor nurse practitioner.  Mliss Spray, FNP-C [facility] 03/12/2024, 10:28 AM

## 2024-03-12 NOTE — Assessment & Plan Note (Signed)
 No concerns at this time. TSH ordered.

## 2024-03-12 NOTE — Assessment & Plan Note (Signed)
 Anxiety manageable at this time. Takes Zoloft  100 mg daily.

## 2024-03-12 NOTE — Assessment & Plan Note (Signed)
 Continues to have migraines with aura that last 3 to 4 days. Takes Nurtec PRN. PRN Zavzpret  ordered.

## 2024-03-13 ENCOUNTER — Ambulatory Visit: Payer: Self-pay | Admitting: Nurse Practitioner

## 2024-03-13 ENCOUNTER — Other Ambulatory Visit: Payer: Self-pay | Admitting: Nurse Practitioner

## 2024-03-13 DIAGNOSIS — G43E09 Chronic migraine with aura, not intractable, without status migrainosus: Secondary | ICD-10-CM

## 2024-03-14 NOTE — Telephone Encounter (Signed)
 Requested medications are due for refill today.  No - see note  Requested medications are on the active medications list.  yes  Last refill. 9/24  Future visit scheduled.   yes  Notes to clinic.  Pharmacy comment: Alternative Requested:THE PRESCRIBED MEDICATION IS NOT COVERED BY INSURANCE. PLEASE CONSIDER CHANGING TO ONE OF THE SUGGESTED COVERED ALTERNATIVES.     Requested Prescriptions  Pending Prescriptions Disp Refills   NURTEC 75 MG TBDP [Pharmacy Med Name: NURTEC ODT 75 MG TABLET]  0     Off-Protocol Failed - 03/14/2024 12:12 PM      Failed - Medication not assigned to a protocol, review manually.      Passed - Valid encounter within last 12 months    Recent Outpatient Visits           2 days ago Immunization due   [facility] Gareth Mliss [place], FNP   6 months ago Acquired hypothyroidism   [facility] Gareth Mliss [place], [place]

## 2024-03-17 ENCOUNTER — Other Ambulatory Visit: Payer: Self-pay | Admitting: Nurse Practitioner

## 2024-03-17 ENCOUNTER — Other Ambulatory Visit (HOSPITAL_COMMUNITY): Payer: Self-pay

## 2024-03-17 ENCOUNTER — Telehealth: Payer: Self-pay | Admitting: Pharmacy Technician

## 2024-03-17 DIAGNOSIS — G43E09 Chronic migraine with aura, not intractable, without status migrainosus: Secondary | ICD-10-CM

## 2024-03-17 NOTE — Telephone Encounter (Signed)
 Pharmacy Patient Advocate Encounter   Received notification from CoverMyMeds that prior authorization for Zavzpret  10MG /ACT solution  is required/requested.   Insurance verification completed.   The patient is insured through [facility] .   Per test claim: PA required; PA submitted to above mentioned insurance via Latent Key/confirmation #/EOC [id number] Status is pending

## 2024-03-17 NOTE — Telephone Encounter (Signed)
 Pharmacy Patient Advocate Encounter  Received notification from [employer] that Prior Authorization for Zavzpret  10MG /ACT solution has been DENIED.  Full denial letter will be uploaded to the media tab. See denial reason below.     PA #/Case ID/Reference #: [id number]

## 2024-03-19 NOTE — Telephone Encounter (Signed)
 Requested medication (s) are due for refill today: yes  Requested medication (s) are on the active medication list: yes  Last refill:  03/12/24  Future visit scheduled: yes  Notes to clinic:    Pharmacy comment: Alternative Requested:PRIOR AUTH. REQUIRED.       Requested Prescriptions  Pending Prescriptions Disp Refills   ZAVZPRET  10 MG/ACT SOLN [Pharmacy Med Name: ZAVZPRET  10 MG NASAL SPRAY] 6 each 1    Sig: Place 10 mg into the nose as needed (migraine).     Off-Protocol Failed - 03/19/2024 11:54 AM      Failed - Medication not assigned to a protocol, review manually.      Passed - Valid encounter within last 12 months    Recent Outpatient Visits           1 week ago Immunization due   [facility] Gareth Mliss [place], FNP   6 months ago Acquired hypothyroidism   [facility] Gareth Mliss [place], [place]

## 2024-03-31 ENCOUNTER — Encounter: Payer: Self-pay | Admitting: Nurse Practitioner

## 2024-03-31 ENCOUNTER — Other Ambulatory Visit (HOSPITAL_COMMUNITY): Payer: Self-pay

## 2024-03-31 ENCOUNTER — Other Ambulatory Visit: Payer: Self-pay | Admitting: Nurse Practitioner

## 2024-03-31 DIAGNOSIS — G43E09 Chronic migraine with aura, not intractable, without status migrainosus: Secondary | ICD-10-CM

## 2024-03-31 MED ORDER — ZAVZPRET 10 MG/ACT NA SOLN
10.0000 mg | NASAL | 1 refills | Status: AC | PRN
Start: 2024-03-31 — End: ?

## 2024-03-31 MED ORDER — NURTEC 75 MG PO TBDP: 75.0000 mg | ORAL_TABLET | ORAL | 5 refills | Status: AC | PRN

## 2024-04-05 ENCOUNTER — Other Ambulatory Visit: Payer: Self-pay | Admitting: Obstetrics and Gynecology

## 2024-04-06 ENCOUNTER — Other Ambulatory Visit: Payer: Self-pay | Admitting: Obstetrics and Gynecology

## 2024-04-06 DIAGNOSIS — F411 Generalized anxiety disorder: Secondary | ICD-10-CM

## 2024-04-16 ENCOUNTER — Ambulatory Visit
Admission: RE | Admit: 2024-04-16 | Discharge: 2024-04-16 | Disposition: A | Discharge status: Home / Self Care | Source: Ambulatory Visit | Attending: Surgery | Admitting: Surgery

## 2024-04-16 DIAGNOSIS — Z1231 Encounter for screening mammogram for malignant neoplasm of breast: Secondary | ICD-10-CM | POA: Insufficient documentation

## 2024-04-18 ENCOUNTER — Encounter: Payer: Self-pay | Admitting: Nurse Practitioner

## 2024-04-18 DIAGNOSIS — F411 Generalized anxiety disorder: Secondary | ICD-10-CM

## 2024-04-18 MED ORDER — SERTRALINE HCL 100 MG PO TABS: ORAL_TABLET | ORAL | 0 refills | Status: DC

## 2024-04-18 MED ORDER — LEVOTHYROXINE SODIUM 75 MCG PO TABS: 75.0000 ug | ORAL_TABLET | Freq: Every day | ORAL | 0 refills | Status: DC

## 2024-04-23 ENCOUNTER — Other Ambulatory Visit (HOSPITAL_COMMUNITY): Payer: Self-pay

## 2024-04-28 ENCOUNTER — Ambulatory Visit: Admitting: Surgery

## 2024-04-30 ENCOUNTER — Ambulatory Visit: Admitting: Surgery

## 2024-04-30 ENCOUNTER — Encounter: Payer: Self-pay | Admitting: Surgery

## 2024-04-30 VITALS — BP 138/91 | HR 68 | Temp 98.0°F | Ht 59.0 in | Wt 168.0 lb

## 2024-04-30 DIAGNOSIS — Z803 Family history of malignant neoplasm of breast: Secondary | ICD-10-CM | POA: Diagnosis not present

## 2024-04-30 DIAGNOSIS — N6489 Other specified disorders of breast: Secondary | ICD-10-CM

## 2024-04-30 NOTE — Patient Instructions (Signed)
 We will see you back in a year for a follow up mammogram and to see Dr Jordis. Our schedule is not available so we placed you in our recalls and will send you a letter with appointment information.   How to Do a Breast Self-Exam Doing breast self-exams can help you stay healthy. They're one way to know what's normal for your breasts. They can help you catch a problem while it's still small and can be treated. You need to: Check your breasts often. Tell your doctor about any changes. You should do breast self-exams even if you have breast implants. What you need: A mirror. A well-lit room. A pillow or other soft object. How to do a breast self-exam Look for changes  Take off all the clothes above your waist. Stand in front of a mirror in a room with good lighting. Put your hands down at your sides. Compare your breasts in the mirror. Look for difference between them, such as: Differences in shape. Differences in size. Wrinkles, dips, and bumps in one breast and not the other. Look at each breast for skin changes, such as: Redness. Scaly spots. Spots where your skin is thicker. Dimpling. Open sores. Look for changes in your nipples, such as: Fluid coming out of a nipple. Fluid around a nipple. Bleeding. Dimpling. Redness. A nipple that looks pushed in or that has changed position. Feel for changes Lie on your back. Feel each breast. To do this: Pick a breast to feel. Place a pillow under the shoulder closest to that breast. Put the arm closest to that breast behind your head. Feel the breast using the hand of your other arm. Use the pads of your three middle fingers to make small circles starting near the nipple. Use light, medium, and firm pressure. Keep making circles, moving down over the breast. Stop when you feel your ribs. Start making circles with your fingers again, this time going up until you reach your collarbone. Then, make circles out across your breast and into  your armpit area. Squeeze your nipple. Check for fluid and lumps. Do these steps again to check your other breast. Sit or stand in the tub or shower. With soapy water  on your skin, feel each breast the same way you did when you were lying down. Write down what you find Writing down what you find can help you keep track of what you want to tell your doctor. Write down: What's normal for each breast. Any changes you find. Write down: The kind of change. If your breast feels tender or painful. Any lump you find. Write down its size and where it is. When you last had your period. General tips If you're breastfeeding, the best time to check your breasts is after you feed your baby or after you use a breast pump. If you get a period, the best time to check your breasts is 5-7 days after your period ends. With time, you'll get more used to doing the self-exam. You'll also start to know if there are changes in your breasts. Contact a doctor if: You see a change in the shape or size of your breasts or nipples. You see a change in the skin of your breast or nipples. You have fluid coming from your nipples that isn't normal. You find a new lump or thick area. You have breast pain. You have any concerns about your breast health. This information is not intended to replace advice given to you by your health  care provider. Make sure you discuss any questions you have with your health care provider. Document Revised: 08/15/2023 Document Reviewed: 08/15/2023 Elsevier Patient Education  2025 [employer].

## 2024-04-30 NOTE — Progress Notes (Signed)
 Outpatient Surgical Follow Up  04/30/2024  Tara Osborn is an 38 y.o. female.   Chief Complaint  Patient presents with   Follow-up    HPI: Tara Osborn is a 38 y.o. female,  Hx of Monoallelic mutation of BRIP1 gene.    She has a Sister with triple negative breast CA at age 38, she has a niece that had colon CA as well . She does have family  ancestry with Ashkenazi heritage. She does have Hx of lap bilateral salpingectomy 04/2022. She did have a right lumpectomy by me 05/2022 for radial scar no evidence of malignancy, chronic fibrocystic changes. She now Comes for exam after mammo, this was pers reviewed showing no concerning lesions.  [name] is doing well and reports no breast issues.  No DC , masses , no pain.  No fevers no chills    Past Medical History:  Diagnosis Date   Allergy    Seasonel   Anxiety    Family history of breast cancer    Genetic testing of female 11/2016   BRIP1 POSITIVE   Headache    High risk of ovarian cancer    Hypothyroidism    Increased risk of breast cancer 12/2016   based on family hx-->IBIS=23.9%, riskscore=20.4%   Monoallelic mutation of BRIP1 gene 12/2016   [facility] MyRisk; increased ovar cancer risk    Past Surgical History:  Procedure Laterality Date   BREAST BIOPSY Right 05/17/2022   US  RT BREAST BX W LOC DEV 1ST LESION IMG BX SPEC US  GUIDE 05/17/2022 [facility]   BREAST BIOPSY Right 05/17/2022   US  RT BREAST BX W LOC DEV EA ADD LESION IMG BX SPEC US  GUIDE 05/17/2022 [facility]   BREAST BIOPSY Right 06/07/2022   MM RT RADIO FREQUENCY TAG LOC MAMMO GUIDE 06/07/2022 [facility]   BREAST BIOPSY Right 12/11/2023   US  RT BREAST BX CYSTIC PAPILLARY APOCRINE METAPLASIA. - NEGATIVE FOR ATYPIA AND MALIGNANCY   BREAST LUMPECTOMY WITH RADIOFREQUENCY TAG IDENTIFICATION Right 06/13/2022   Procedure: BREAST LUMPECTOMY WITH RADIOFREQUENCY TAG IDENTIFICATION, RNFA to assist;  Surgeon: Jordis Laneta FALCON, MD;  Location: [facility];  Service: General;  Laterality: Right;   FOOT GANGLION EXCISION Left    INTRAUTERINE DEVICE (IUD) INSERTION N/A 05/08/2022   Procedure: INTRAUTERINE DEVICE (IUD) INSERTION;  Surgeon: Connell Davies, MD;  Location: [facility];  Service: Gynecology;  Laterality: N/A;   IUD REMOVAL N/A 05/08/2022   Procedure: INTRAUTERINE DEVICE (IUD) REMOVAL;  Surgeon: Connell Davies, MD;  Location: [facility];  Service: Gynecology;  Laterality: N/A;   LAPAROSCOPIC BILATERAL SALPINGECTOMY Bilateral 05/08/2022   Procedure: LAPAROSCOPIC BILATERAL SALPINGECTOMY;  Surgeon: Connell Davies, MD;  Location: [facility];  Service: Gynecology;  Laterality: Bilateral;   LAPAROSCOPIC OVARIAN CYSTECTOMY Right 05/08/2022   Procedure: RIGHT OVARIAN CYSTECTOMY;  Surgeon: Connell Davies, MD;  Location: [facility];  Service: Gynecology;  Laterality: Right;    Family History  Problem Relation Age of Onset   Hypertension Mother    Hypothyroidism Mother    Stroke Mother    Varicose Veins Mother    Hypertension Father    Hypothyroidism Father    Cancer Sister    Breast cancer Sister 27       triple negative; currently 47   Hypothyroidism Sister    Other Maternal Grandfather        myocardial infarction   Other Paternal Grandfather        myocardial infarction   Colon cancer Niece 54   Cancer Sister  Social History:  reports that she has never smoked. She has never been exposed to tobacco smoke. She has never used smokeless tobacco. She reports that she does not drink alcohol and does not use drugs.  Allergies: No Known Allergies  Medications reviewed.    ROS Full ROS performed and is otherwise negative other than what is stated in HPI   BP (!) 138/91   Pulse 68   Temp 98 F (36.7 C) (Oral)   Ht 4' 11 (1.499 m)   Wt 168 lb (76.2 kg)   SpO2 98%   BMI 33.93 kg/m   Physical Exam  CONSTITUTIONAL: NAD. Chaperone Present EYES: Pupils are equal, round,  Sclera are non-icteric. LYMPH NODES:  Lymph nodes in the  neck are normal. Neck w/o megalies or lesions BREAST : Nipple and skin is normal. Right lumpectomy scar with very nice contour and w/o deformities, Bilateral axillas are free of any lymphadenopathy.  Left breast completely normal RESPIRATORY:  Lungs are clear. There is normal respiratory effort, with equal breath sounds bilaterally, and without pathologic use of accessory muscles. CARDIOVASCULAR: Heart is regular without murmurs, gallops, or rubs. GI: The abdomen is  soft, nontender, and nondistended. There are no palpable masses. There is no hepatosplenomegaly. There are normal bowel sounds in all quadrants. GU: Rectal deferred.   MUSCULOSKELETAL: Normal muscle strength and tone. No cyanosis or edema.   SKIN: Turgor is good and there are no pathologic skin lesions or ulcers. NEUROLOGIC: Motor and sensation is grossly normal. Cranial nerves are grossly intact. PSYCH:  Oriented to person, place and time. Affect is normal.   Assessment/Plan: 38 yo s/p right lumpectomy for radial scar and  significant family history of breast cancer. .No new breast issues at this time and both clinically and radiolographic w/o breast pathology. We will see her on a yearly basis for exam and review of images. I personally spent a total of 30 minutes in the care of the patient today including performing a medically appropriate exam/evaluation, counseling and educating, placing orders, referring and communicating with other health care professionals, documenting clinical information in the EHR, independently interpreting and reviewing images studies and coordinating care.   Laneta Luna, MD [facility] General Surgeon

## 2024-05-19 ENCOUNTER — Other Ambulatory Visit (HOSPITAL_COMMUNITY): Payer: Self-pay

## 2024-06-09 ENCOUNTER — Other Ambulatory Visit (HOSPITAL_COMMUNITY): Payer: Self-pay

## 2024-07-02 ENCOUNTER — Telehealth: Payer: Self-pay | Admitting: Pharmacy Technician

## 2024-07-02 ENCOUNTER — Other Ambulatory Visit (HOSPITAL_COMMUNITY): Payer: Self-pay

## 2024-07-02 NOTE — Telephone Encounter (Signed)
 Pharmacy Patient Advocate Encounter  Received notification from [employer] that Prior Authorization for Zavzpret  10MG /ACT solution has been APPROVED from 07/02/24 to 09/30/24. Ran test claim, Copay is $0.00. This test claim was processed through [facility]- copay amounts may vary at other pharmacies due to pharmacy/plan contracts, or as the patient moves through the different stages of their insurance plan.   PA #/Case ID/Reference #: [id number]

## 2024-07-02 NOTE — Telephone Encounter (Signed)
 Pharmacy Patient Advocate Encounter   Received notification from CoverMyMeds that prior authorization for Zavzpret  10MG /ACT solution is required/requested.   Insurance verification completed.   The patient is insured through [facility].   Per test claim: PA required; PA started via CoverMyMeds. KEY [id number] . Waiting for clinical questions to populate.

## 2024-07-17 ENCOUNTER — Other Ambulatory Visit: Payer: Self-pay | Admitting: Nurse Practitioner

## 2024-07-17 DIAGNOSIS — F411 Generalized anxiety disorder: Secondary | ICD-10-CM

## 2024-07-17 NOTE — Telephone Encounter (Signed)
 Requested Prescriptions  Pending Prescriptions Disp Refills   levothyroxine  (SYNTHROID ) 75 MCG tablet [Pharmacy Med Name: LEVOTHYROXINE  75 MCG TABLET] 90 tablet 0    Sig: TAKE 1 TABLET BY MOUTH EVERY DAY     Endocrinology:  Hypothyroid Agents Passed - 07/17/2024  3:50 PM      Passed - TSH in normal range and within 360 days    TSH  Date Value Ref Range Status  03/12/2024 1.50 mIU/L Final    Comment:              Reference Range .           > or = 20 Years  0.40-4.50 .                Pregnancy Ranges           First trimester    0.26-2.66           Second trimester   0.55-2.73           Third trimester    0.43-2.91          Passed - Valid encounter within last 12 months    Recent Outpatient Visits           4 months ago Immunization due   [facility] Gareth Mliss FALCON, FNP   10 months ago Acquired hypothyroidism   [facility] Gareth Mliss FALCON, FNP               sertraline  (ZOLOFT ) 100 MG tablet [Pharmacy Med Name: SERTRALINE  HCL 100 MG TABLET] 135 tablet 0    Sig: TAKE 1.5 TABLETS (150 MG TOTAL) BY MOUTH DAILY     Psychiatry:  Antidepressants - SSRI - sertraline  Passed - 07/17/2024  3:50 PM      Passed - AST in normal range and within 360 days    AST  Date Value Ref Range Status  08/29/2023 18 10 - 30 U/L Final         Passed - ALT in normal range and within 360 days    ALT  Date Value Ref Range Status  08/29/2023 18 6 - 29 U/L Final         Passed - Completed PHQ-2 or PHQ-9 in the last 360 days      Passed - Valid encounter within last 6 months    Recent Outpatient Visits           4 months ago Immunization due   [facility] Gareth Mliss FALCON, FNP   10 months ago Acquired hypothyroidism   [facility] Gareth Mliss FALCON, [place]

## 2024-09-11 ENCOUNTER — Ambulatory Visit: Admitting: Nurse Practitioner
